# Patient Record
Sex: Male | Born: 1975 | Race: White | Hispanic: No | Marital: Married | Smoking: Current every day smoker
Health system: Southern US, Community
[De-identification: ages and names within clinical notes are randomized; demographics above are authoritative.]

## PROBLEM LIST (undated history)

## (undated) DIAGNOSIS — F1120 Opioid dependence, uncomplicated: Secondary | ICD-10-CM

## (undated) DIAGNOSIS — R945 Abnormal results of liver function studies: Secondary | ICD-10-CM

## (undated) DIAGNOSIS — E7800 Pure hypercholesterolemia, unspecified: Secondary | ICD-10-CM

## (undated) DIAGNOSIS — F419 Anxiety disorder, unspecified: Secondary | ICD-10-CM

## (undated) DIAGNOSIS — G8929 Other chronic pain: Secondary | ICD-10-CM

## (undated) DIAGNOSIS — I10 Essential (primary) hypertension: Secondary | ICD-10-CM

## (undated) DIAGNOSIS — R12 Heartburn: Secondary | ICD-10-CM

## (undated) DIAGNOSIS — K7581 Nonalcoholic steatohepatitis (NASH): Secondary | ICD-10-CM

## (undated) DIAGNOSIS — G4700 Insomnia, unspecified: Secondary | ICD-10-CM

## (undated) DIAGNOSIS — K602 Anal fissure, unspecified: Secondary | ICD-10-CM

## (undated) DIAGNOSIS — R51 Headache: Secondary | ICD-10-CM

## (undated) DIAGNOSIS — G629 Polyneuropathy, unspecified: Secondary | ICD-10-CM

## (undated) DIAGNOSIS — M1990 Unspecified osteoarthritis, unspecified site: Secondary | ICD-10-CM

## (undated) DIAGNOSIS — J449 Chronic obstructive pulmonary disease, unspecified: Secondary | ICD-10-CM

## (undated) DIAGNOSIS — E785 Hyperlipidemia, unspecified: Secondary | ICD-10-CM

## (undated) HISTORY — DX: Unspecified osteoarthritis, unspecified site: M19.90

## (undated) HISTORY — DX: Hyperlipidemia, unspecified: E78.5

## (undated) HISTORY — DX: Headache: R51

## (undated) HISTORY — DX: Polyneuropathy, unspecified: G62.9

## (undated) HISTORY — DX: Heartburn: R12

## (undated) HISTORY — DX: Nonalcoholic steatohepatitis (NASH): K75.81

## (undated) HISTORY — PX: MYRINGOPLASTY: SUR873

## (undated) HISTORY — PX: APPENDECTOMY: SHX54

## (undated) HISTORY — DX: Insomnia, unspecified: G47.00

## (undated) HISTORY — DX: Anxiety disorder, unspecified: F41.9

## (undated) HISTORY — PX: TESTICLE REMOVAL: SHX68

## (undated) HISTORY — PX: CHOLECYSTECTOMY: SHX55

## (undated) HISTORY — DX: Abnormal results of liver function studies: R94.5

## (undated) HISTORY — PX: RECTAL SURGERY: SHX760

## (undated) HISTORY — DX: Opioid dependence, uncomplicated: F11.20

## (undated) HISTORY — PX: INTRAOCULAR PROSTHESES INSERTION: SHX360

## (undated) HISTORY — DX: Chronic obstructive pulmonary disease, unspecified: J44.9

## (undated) HISTORY — DX: Other chronic pain: G89.29

---

## 1998-07-12 ENCOUNTER — Emergency Department (HOSPITAL_COMMUNITY): Admission: EM | Admit: 1998-07-12 | Discharge: 1998-07-12 | Payer: Self-pay | Admitting: Emergency Medicine

## 2009-05-21 ENCOUNTER — Emergency Department (HOSPITAL_COMMUNITY): Admission: EM | Admit: 2009-05-21 | Discharge: 2009-05-21 | Payer: Self-pay | Admitting: Emergency Medicine

## 2013-10-08 DIAGNOSIS — M545 Low back pain, unspecified: Secondary | ICD-10-CM | POA: Insufficient documentation

## 2013-10-08 DIAGNOSIS — F411 Generalized anxiety disorder: Secondary | ICD-10-CM | POA: Insufficient documentation

## 2013-10-08 DIAGNOSIS — E663 Overweight: Secondary | ICD-10-CM | POA: Insufficient documentation

## 2013-11-05 DIAGNOSIS — G44209 Tension-type headache, unspecified, not intractable: Secondary | ICD-10-CM | POA: Insufficient documentation

## 2016-01-26 DIAGNOSIS — G4440 Drug-induced headache, not elsewhere classified, not intractable: Secondary | ICD-10-CM | POA: Insufficient documentation

## 2016-01-26 DIAGNOSIS — G43019 Migraine without aura, intractable, without status migrainosus: Secondary | ICD-10-CM | POA: Insufficient documentation

## 2017-10-28 ENCOUNTER — Emergency Department (HOSPITAL_COMMUNITY): Payer: Medicaid Other

## 2017-10-28 ENCOUNTER — Encounter (HOSPITAL_COMMUNITY): Payer: Self-pay | Admitting: Emergency Medicine

## 2017-10-28 ENCOUNTER — Emergency Department (HOSPITAL_COMMUNITY)
Admission: EM | Admit: 2017-10-28 | Discharge: 2017-10-28 | Disposition: A | Discharge status: Home / Self Care | Payer: Medicaid Other | Attending: Emergency Medicine | Admitting: Emergency Medicine

## 2017-10-28 DIAGNOSIS — N50819 Testicular pain, unspecified: Secondary | ICD-10-CM

## 2017-10-28 DIAGNOSIS — R1030 Lower abdominal pain, unspecified: Secondary | ICD-10-CM | POA: Diagnosis not present

## 2017-10-28 DIAGNOSIS — N50811 Right testicular pain: Secondary | ICD-10-CM | POA: Diagnosis present

## 2017-10-28 DIAGNOSIS — I10 Essential (primary) hypertension: Secondary | ICD-10-CM | POA: Diagnosis not present

## 2017-10-28 DIAGNOSIS — F17200 Nicotine dependence, unspecified, uncomplicated: Secondary | ICD-10-CM | POA: Diagnosis not present

## 2017-10-28 DIAGNOSIS — R000 Tachycardia, unspecified: Secondary | ICD-10-CM | POA: Diagnosis not present

## 2017-10-28 HISTORY — DX: Pure hypercholesterolemia, unspecified: E78.00

## 2017-10-28 HISTORY — DX: Essential (primary) hypertension: I10

## 2017-10-28 HISTORY — DX: Anal fissure, unspecified: K60.2

## 2017-10-28 LAB — URINALYSIS, ROUTINE W REFLEX MICROSCOPIC
BILIRUBIN URINE: NEGATIVE
GLUCOSE, UA: NEGATIVE mg/dL
HGB URINE DIPSTICK: NEGATIVE
Ketones, ur: NEGATIVE mg/dL
Leukocytes, UA: NEGATIVE
Nitrite: NEGATIVE
Protein, ur: NEGATIVE mg/dL
Specific Gravity, Urine: 1.014 (ref 1.005–1.030)
pH: 8 (ref 5.0–8.0)

## 2017-10-28 MED ORDER — LIDOCAINE HCL (PF) 1 % IJ SOLN
INTRAMUSCULAR | Status: AC
  Administered 2017-10-28: 5 mL
  Filled 2017-10-28: qty 5

## 2017-10-28 MED ORDER — METHOCARBAMOL 500 MG PO TABS: 500.0000 mg | ORAL_TABLET | Freq: Two times a day (BID) | ORAL | 0 refills | Status: AC

## 2017-10-28 MED ORDER — CEFTRIAXONE SODIUM 250 MG IJ SOLR
250.0000 mg | Freq: Once | INTRAMUSCULAR | Status: AC
  Administered 2017-10-28: 250 mg via INTRAMUSCULAR
  Filled 2017-10-28: qty 250

## 2017-10-28 MED ORDER — AZITHROMYCIN 250 MG PO TABS
1000.0000 mg | ORAL_TABLET | Freq: Once | ORAL | Status: AC
  Administered 2017-10-28: 1000 mg via ORAL
  Filled 2017-10-28: qty 4

## 2017-10-28 MED ORDER — OXYCODONE-ACETAMINOPHEN 5-325 MG PO TABS
1.0000 | ORAL_TABLET | Freq: Once | ORAL | Status: AC
  Administered 2017-10-28: 1 via ORAL
  Filled 2017-10-28: qty 1

## 2017-10-28 MED ORDER — LEVOFLOXACIN 500 MG PO TABS: 500.0000 mg | ORAL_TABLET | Freq: Every day | ORAL | 0 refills | Status: AC

## 2017-10-28 MED ORDER — MORPHINE SULFATE (PF) 4 MG/ML IV SOLN: 4.0000 mg | Freq: Once | INTRAVENOUS | Status: DC

## 2017-10-28 NOTE — Discharge Instructions (Signed)
Take Levaquin as directed. Follow-up with your urologist for further evaluation. Return to ED for worsening symptoms, fevers, severe abdominal pain, lightheadedness, chest pain.

## 2017-10-28 NOTE — ED Provider Notes (Signed)
[facility] EMERGENCY DEPARTMENT Provider Note   CSN: [id number] Arrival date & time: 10/28/17  1939     History   Chief Complaint Chief Complaint  Patient presents with  . Testicle Pain    HPI Ryan [facility] is a 42 y.o. male with a past medical history of hypertension, status post left orchiectomy done 5 years ago, presents for acute onset of right-sided testicle pain, bladder pain radiating to bilateral flank.  He was receiving oral sexual intercourse from his wife.  States that he sometimes gets pain with ejaculation.  States that the pain today was a lot more severe and he noticed that the ejaculate was blood-tinged.  He denies any history of kidney stones.  Denies any penile discharge or concern for STDs.  He was told that he had a cyst on his right testicle sometime last year.  States that he had a left orchiectomy done because of possible nerve pain/muscle spasm in the area.  Patient denies any vomiting, fever, bowel changes, hematuria.  HPI  Past Medical History:  Diagnosis Date  . High cholesterol   . Hypertension   . Rectal fissure     There are no active problems to display for this patient.   Past Surgical History:  Procedure Laterality Date  . APPENDECTOMY    . CHOLECYSTECTOMY    . INTRAOCULAR PROSTHESES INSERTION    . MYRINGOPLASTY    . TESTICLE REMOVAL Left         Home Medications    Prior to Admission medications   Medication Sig Start Date End Date Taking? Authorizing Provider  levofloxacin (LEVAQUIN) 500 MG tablet Take 1 tablet (500 mg total) by mouth daily for 14 days. 10/28/17 11/11/17  Haitham Dolinsky, PA-C  methocarbamol (ROBAXIN) 500 MG tablet Take 1 tablet (500 mg total) by mouth 2 (two) times daily. 10/28/17   Delia Heady, PA-C    Family History No family history on file.  Social History Social History   Tobacco Use  . Smoking status: Current Every Day Smoker  . Smokeless tobacco: Never Used  Substance Use Topics  .  Alcohol use: Yes    Comment: occ  . Drug use: Never     Allergies   Cymbalta [duloxetine hcl]; Mobic [meloxicam]; Nortriptyline; Toradol [ketorolac tromethamine]; and Tramadol   Review of Systems Review of Systems  Constitutional: Negative for appetite change, chills and fever.  HENT: Negative for ear pain, rhinorrhea, sneezing and sore throat.   Eyes: Negative for photophobia and visual disturbance.  Respiratory: Negative for cough, chest tightness, shortness of breath and wheezing.   Cardiovascular: Negative for chest pain and palpitations.  Gastrointestinal: Positive for abdominal pain. Negative for blood in stool, constipation, diarrhea, nausea and vomiting.  Genitourinary: Positive for flank pain and testicular pain. Negative for dysuria, enuresis, hematuria, penile swelling, scrotal swelling and urgency.       +bloody ejaculate  Musculoskeletal: Negative for myalgias.  Skin: Negative for rash.  Neurological: Negative for dizziness, weakness and light-headedness.     Physical Exam Updated Vital Signs BP (!) 150/98   Pulse (!) 115   Temp 98.4 F (36.9 C) (Oral)   Resp 20   Ht 5' 9"  (1.753 m)   Wt 93.4 kg (206 lb)   SpO2 99%   BMI 30.42 kg/m   Physical Exam  Constitutional: He appears well-developed and well-nourished. No distress.  HENT:  Head: Normocephalic and atraumatic.  Nose: Nose normal.  Eyes: Conjunctivae and EOM are normal. Left eye  exhibits no discharge. No scleral icterus.  Neck: Normal range of motion. Neck supple.  Cardiovascular: Regular rhythm, normal heart sounds and intact distal pulses. Tachycardia present. Exam reveals no gallop and no friction rub.  No murmur heard. Pulmonary/Chest: Effort normal and breath sounds normal. No respiratory distress.  Abdominal: Soft. Bowel sounds are normal. He exhibits no distension. There is tenderness in the suprapubic area. There is CVA tenderness. There is no guarding.    Genitourinary: Right testis shows  tenderness. Right testis shows no swelling. Circumcised.  Genitourinary Comments: No L testicle noted. Normal male genitalia noted. Penis, scrotum, and testicle without swelling, lesions, or rashes noted.  There is point tenderness of the right testicle.  No penile discharge noted. Cremasteric reflex intact. Tech served as Producer, television/film/video during the exam.   Musculoskeletal: Normal range of motion. He exhibits no edema.  Neurological: He is alert. He exhibits normal muscle tone. Coordination normal.  Skin: Skin is warm and dry. No rash noted.  Psychiatric: He has a normal mood and affect.  Nursing note and vitals reviewed.    ED Treatments / Results  Labs (all labs ordered are listed, but only abnormal results are displayed) Labs Reviewed  URINALYSIS, ROUTINE W REFLEX MICROSCOPIC - Abnormal; Notable for the following components:      Result Value   APPearance CLOUDY (*)    All other components within normal limits  GC/CHLAMYDIA PROBE AMP ([place]) NOT AT [facility]    EKG None  Radiology Ct Renal Stone Study  Result Date: 10/28/2017 CLINICAL DATA:  Left groin pain EXAM: CT ABDOMEN AND PELVIS WITHOUT CONTRAST TECHNIQUE: Multidetector CT imaging of the abdomen and pelvis was performed following the standard protocol without IV contrast. COMPARISON:  CT 07/06/2017 FINDINGS: Lower chest: Lung bases demonstrate no acute consolidation or effusion. Normal heart size. Hepatobiliary: No focal liver abnormality is seen. Status post cholecystectomy. No biliary dilatation. Pancreas: Unremarkable. No pancreatic ductal dilatation or surrounding inflammatory changes. Spleen: Normal in size without focal abnormality. Adrenals/Urinary Tract: Adrenal glands are unremarkable. Kidneys are normal, without renal calculi, focal lesion, or hydronephrosis. Bladder is unremarkable. Stomach/Bowel: Stomach is within normal limits. Appendix appears surgically absent. No evidence of bowel wall thickening, distention, or  inflammatory changes. Vascular/Lymphatic: Mild aortic atherosclerosis. No aneurysmal dilatation. No significantly enlarged lymph nodes. Reproductive: Prostate is unremarkable. Other: Small fat in the right inguinal canal. No free air or free fluid. Musculoskeletal: No acute or significant osseous findings. IMPRESSION: 1. Negative for nephrolithiasis, hydronephrosis or ureteral stone 2. No CT evidence for acute intra-abdominal or pelvic abnormality Electronically Signed   By: Donavan Foil M.D.   On: 10/28/2017 21:56   US Scrotum W/doppler  Result Date: 10/28/2017 CLINICAL DATA:  RIGHT testicular pain intermittently for years, post LEFT orchiectomy 5 years ago EXAM: SCROTAL ULTRASOUND DOPPLER ULTRASOUND OF THE TESTICLES TECHNIQUE: Complete ultrasound examination of the testicles, epididymis, and other scrotal structures was performed. Color and spectral Doppler ultrasound were also utilized to evaluate blood flow to the testicles. COMPARISON:  04/19/2017 FINDINGS: Right testicle Measurements: 4.8 x 3.4 x 2.9 cm. Normal morphology without mass or calcification. Internal blood flow present on color Doppler imaging. Left testicle Surgically absent Right epididymis:  Normal in size and appearance. Left epididymis:  Surgically absent Hydrocele:  Small RIGHT hydrocele. Varicocele:  None visualized. Pulsed Doppler interrogation of both testes demonstrates normal low resistance arterial and venous waveforms bilaterally. IMPRESSION: Normal appearing RIGHT testis and RIGHT epididymis. Small nonspecific RIGHT hydrocele. Post LEFT orchiectomy. Electronically Signed  By: Lavonia Dana M.D.   On: 10/28/2017 21:12    Procedures Procedures (including critical care time)  Medications Ordered in ED Medications  cefTRIAXone (ROCEPHIN) injection 250 mg (has no administration in time range)  azithromycin (ZITHROMAX) tablet 1,000 mg (has no administration in time range)  lidocaine (PF) (XYLOCAINE) 1 % injection (has no  administration in time range)  oxyCODONE-acetaminophen (PERCOCET/ROXICET) 5-325 MG per tablet 1 tablet (1 tablet Oral Given 10/28/17 2124)     Initial Impression / Assessment and Plan / ED Course  I have reviewed the triage vital signs and the nursing notes.  Pertinent labs & imaging results that were available during my care of the patient were reviewed by me and considered in my medical decision making (see chart for details).     42 year old male with a past medical history of hypertension, status post left orchiectomy done 5 years ago presents for acute onset of right-sided testicle pain, bladder pain radiating to bilateral flank.  He was receiving oral sexual intercourse by his wife when he began having severe pain with ejaculation.  He noticed his ejaculate was blood-tinged.  Denies any history of kidney stones.  Denies any penile discharge or concern for STDs.  He was told sometime last year that he had a cyst on his right testicle.  Denies any vomiting, bowel changes, fever, hematuria.  On physical exam patient has tenderness to palpation of the lower abdomen and bilateral CVA tenderness.  No abnormalities noted on GU exam.  Scrotal ultrasound negative for acute abnormality of the right testicle and epididymis.  Urinalysis with no evidence of infection.  Gonorrhea and chlamydia probe sent.  CT renal stone study with no acute findings.  Will treat patient empirically for gonorrhea and chlamydia, cover with Levaquin for possible prostatitis.  Advised him to follow-up with urology and to return to ED for any severe worsening symptoms.  Portions of this note were generated with Lobby[employer]. Dictation errors may occur despite best attempts at proofreading.   Final Clinical Impressions(s) / ED Diagnoses   Final diagnoses:  Testicular pain    ED Discharge Orders        Ordered    levofloxacin (LEVAQUIN) 500 MG tablet  Daily     10/28/17 2203    methocarbamol (ROBAXIN) 500 MG  tablet  2 times daily     10/28/17 2219       Delia Heady, PA-C 10/28/17 2219    Pattricia Boss, MD 10/29/17 [phone]

## 2017-10-28 NOTE — ED Triage Notes (Addendum)
Pt c/o pain R sided testicle pain since climaxing today during oral sex with wife. Pt states he could see bright red blood in ejaculate today.  Pt reports reoccurring pain in testicles since having L testicle removed 5 years ago. Last year pt had u/s of R testicle showing ?cyst, no surgical intervention occurred with latest finding.  Pt states pain now radiating into bladder groin  And around to flank

## 2018-03-17 ENCOUNTER — Encounter: Payer: Self-pay | Admitting: Cardiology

## 2018-03-18 ENCOUNTER — Encounter (HOSPITAL_COMMUNITY): Payer: Self-pay

## 2018-03-18 ENCOUNTER — Other Ambulatory Visit: Payer: Self-pay

## 2018-03-18 ENCOUNTER — Emergency Department (HOSPITAL_COMMUNITY)
Admission: EM | Admit: 2018-03-18 | Discharge: 2018-03-18 | Disposition: A | Discharge status: Home / Self Care | Payer: Medicaid Other | Attending: Emergency Medicine | Admitting: Emergency Medicine

## 2018-03-18 DIAGNOSIS — R2243 Localized swelling, mass and lump, lower limb, bilateral: Secondary | ICD-10-CM | POA: Insufficient documentation

## 2018-03-18 DIAGNOSIS — M79605 Pain in left leg: Secondary | ICD-10-CM | POA: Diagnosis not present

## 2018-03-18 DIAGNOSIS — R1907 Generalized intra-abdominal and pelvic swelling, mass and lump: Secondary | ICD-10-CM | POA: Diagnosis not present

## 2018-03-18 DIAGNOSIS — R609 Edema, unspecified: Secondary | ICD-10-CM | POA: Diagnosis present

## 2018-03-18 DIAGNOSIS — F17200 Nicotine dependence, unspecified, uncomplicated: Secondary | ICD-10-CM | POA: Insufficient documentation

## 2018-03-18 DIAGNOSIS — R0789 Other chest pain: Secondary | ICD-10-CM | POA: Insufficient documentation

## 2018-03-18 DIAGNOSIS — M79604 Pain in right leg: Secondary | ICD-10-CM | POA: Diagnosis not present

## 2018-03-18 DIAGNOSIS — I10 Essential (primary) hypertension: Secondary | ICD-10-CM | POA: Insufficient documentation

## 2018-03-18 NOTE — Discharge Instructions (Addendum)
The swelling your lower legs should improve with the use the diuretic medicine that you are prescribed recently.  Additionally make sure that you are sleeping as flat as possible, to improve blood flow to your heart.  Once in your heart the blood can carry fluids to your kidneys and then you can urinate.  It will help to use compression stockings on your lower legs, to help push the fluid back up.  Also staying in a low-sodium diet is very important for people that have edema.  Return here if needed, for worsening symptoms or other concerns.

## 2018-03-18 NOTE — ED Provider Notes (Signed)
[facility] Provider Note   CSN: [id number] Arrival date & time: 03/18/18  1940     History   Chief Complaint Chief Complaint  Patient presents with  . Leg Swelling  . Weight Gain  . Shortness of Breath    HPI Ryan [facility] is a 42 y.o. male.  HPI   He is here for evaluation of a multitude of symptoms.  Primarily today he is concerned about pain and swelling in the legs, bilaterally which has worsened over the last week.  The pain was so bad that he decided to see a doctor at the [facility] in [place], [place], 3 days ago.  At that time he had comprehensive evaluation, apparently with CT imaging cardiac evaluation and blood work.  After that he was prescribed a diuretic pill, but he did not start taking it, yet.  He is somewhat mistrustful of the diagnosis, therefore came here.  He also states that he has chronic difficulty lying flat because he gets short of breath.  Because of that he tends to sleep in a recliner.  He relates having vague chest discomfort for 10 months.  He reports having a 16 pound weight gain in the last week.  He complains of anxiety.  He is not currently working.  He is here with his wife who is concerned about some swelling in his abdomen.  There are no other known modifying factors.  Past Medical History:  Diagnosis Date  . High cholesterol   . Hypertension   . Rectal fissure     There are no active problems to display for this patient.   Past Surgical History:  Procedure Laterality Date  . APPENDECTOMY    . CHOLECYSTECTOMY    . INTRAOCULAR PROSTHESES INSERTION    . MYRINGOPLASTY    . TESTICLE REMOVAL Left         Home Medications    Prior to Admission medications   Medication Sig Start Date End Date Taking? Authorizing Provider  methocarbamol (ROBAXIN) 500 MG tablet Take 1 tablet (500 mg total) by mouth 2 (two) times daily. 10/28/17   Delia Heady, PA-C    Family History History  reviewed. No pertinent family history.  Social History Social History   Tobacco Use  . Smoking status: Current Every Day Smoker  . Smokeless tobacco: Never Used  Substance Use Topics  . Alcohol use: Yes    Comment: occ  . Drug use: Never     Allergies   Cymbalta [duloxetine hcl]; Mobic [meloxicam]; Nortriptyline; Toradol [ketorolac tromethamine]; and Tramadol   Review of Systems Review of Systems  All other systems reviewed and are negative.    Physical Exam Updated Vital Signs BP (!) 146/92 (BP Location: Right Arm)   Pulse (!) 112   Temp 98.8 F (37.1 C) (Oral)   Ht 5' 8"  (1.727 m)   Wt 105.2 kg   SpO2 100%   BMI 35.28 kg/m   Physical Exam  Constitutional: He is oriented to person, place, and time. He appears well-developed and well-nourished. He appears distressed (Uncomfortable).  HENT:  Head: Normocephalic and atraumatic.  Right Ear: External ear normal.  Left Ear: External ear normal.  Eyes: Pupils are equal, round, and reactive to light. Conjunctivae and EOM are normal.  Neck: Normal range of motion and phonation normal. Neck supple.  Cardiovascular: Normal rate, regular rhythm and normal heart sounds.  Pulmonary/Chest: Effort normal and breath sounds normal. He exhibits no bony tenderness.  Abdominal:  Soft. He exhibits no distension and no mass. There is no tenderness. There is no guarding.  Musculoskeletal: Normal range of motion. He exhibits edema (Mild bilateral lower extremity edema 1-2+.).  Nonspecific diffuse tenderness of the lower legs and feet bilaterally.  No deformities of the legs appreciated.  No overlying skin changes.  Extremities are warm and dry.  Neurological: He is alert and oriented to person, place, and time. No cranial nerve deficit or sensory deficit. He exhibits normal muscle tone. Coordination normal.  Skin: Skin is warm, dry and intact.  Nonspecific irregular small patch of redness, right anterior shin.  No areas of drainage,  bleeding or petechiae.  Psychiatric: He has a normal mood and affect. His behavior is normal. Judgment and thought content normal.  Nursing note and vitals reviewed.    ED Treatments / Results  Labs (all labs ordered are listed, but only abnormal results are displayed) Labs Reviewed - No data to display  EKG None  Radiology No results found.  Procedures Procedures (including critical care time)  Medications Ordered in ED Medications - No data to display   Initial Impression / Assessment and Plan / ED Course  I have reviewed the triage vital signs and the nursing notes.  Pertinent labs & imaging results that were available during my care of the patient were reviewed by me and considered in my medical decision making (see chart for details).      Patient Vitals for the past 24 hrs:  BP Temp Temp src Pulse SpO2 Height Weight  03/18/18 2024 - - - - - 5' 8"  (1.727 m) 105.2 kg  03/18/18 2022 (!) 146/92 98.8 F (37.1 C) Oral (!) 112 100 % - -  03/18/18 2020 - - - - 100 % - -    9:42 PM Reevaluation with update and discussion. After initial assessment and treatment, an updated evaluation reveals no change in clinical status.  We discussed his recent imaging which I am able to see through the PACS system.  He had negative chest x-ray and CT angiogram, several days ago at [facility].  He was prescribed a diuretic which she has not started taking yet.  Findings discussed with patient and wife, questions answered.Daleen Bo   Medical Decision Making: Nonspecific lower extremity swelling without evidence for acute congestive heart failure, metabolic instability or impending vascular collapse.  Patient had recent comprehensive imaging, and an outlying hospital.  Patient essentially came here for second opinion, and I reassured him that he is on the proper treatment course with a diuretic, and then close follow-up with his PCP.  He is wife are in agreement with that plan.  We  also discussed use of compression stockings and low-sodium diet.  CRITICAL CARE-no Performed by: Daleen Bo  Nursing Notes Reviewed/ Care Coordinated Applicable Imaging Reviewed Interpretation of Laboratory Data incorporated into ED treatment  The patient appears reasonably screened and/or stabilized for discharge and I doubt any other medical condition or other [facility] requiring further screening, evaluation, or treatment in the ED at this time prior to discharge.  Plan: Home Medications-continue current prescribed medications; Home Treatments-rest, elevation, low-salt diet.; return here if the recommended treatment, does not improve the symptoms; Recommended follow up-recheck in 1 week and as needed.    Final Clinical Impressions(s) / ED Diagnoses   Final diagnoses:  Peripheral edema    ED Discharge Orders    None       Daleen Bo, MD 03/19/18 1154

## 2018-03-18 NOTE — ED Triage Notes (Signed)
Pt states he has been gaining weight over past 2 months (30 lbs). Here today for bilat lower extremity edema, abdominal distention, SOB, and fatigue. States dr gave him fluid pills one time but never tested him for anything. Wife and son at bedside.

## 2018-04-19 ENCOUNTER — Ambulatory Visit: Payer: Medicaid Other | Admitting: Neurology

## 2018-04-26 ENCOUNTER — Encounter: Payer: Self-pay | Admitting: Cardiology

## 2018-04-26 DIAGNOSIS — R55 Syncope and collapse: Secondary | ICD-10-CM | POA: Insufficient documentation

## 2018-04-26 DIAGNOSIS — E785 Hyperlipidemia, unspecified: Secondary | ICD-10-CM | POA: Insufficient documentation

## 2018-05-03 ENCOUNTER — Encounter: Payer: Self-pay | Admitting: Cardiology

## 2018-05-03 ENCOUNTER — Ambulatory Visit (INDEPENDENT_AMBULATORY_CARE_PROVIDER_SITE_OTHER): Payer: Medicaid Other | Admitting: Cardiology

## 2018-05-03 VITALS — BP 140/82 | HR 91 | Ht 68.0 in | Wt 220.0 lb

## 2018-05-03 DIAGNOSIS — R0789 Other chest pain: Secondary | ICD-10-CM | POA: Insufficient documentation

## 2018-05-03 DIAGNOSIS — R55 Syncope and collapse: Secondary | ICD-10-CM

## 2018-05-03 DIAGNOSIS — E785 Hyperlipidemia, unspecified: Secondary | ICD-10-CM

## 2018-05-03 DIAGNOSIS — R002 Palpitations: Secondary | ICD-10-CM | POA: Insufficient documentation

## 2018-05-03 DIAGNOSIS — F17200 Nicotine dependence, unspecified, uncomplicated: Secondary | ICD-10-CM | POA: Insufficient documentation

## 2018-05-03 NOTE — Patient Instructions (Signed)
Medication Instructions:   Your physician recommends that you continue on your current medications as directed. Please refer to the Current Medication list given to you today.  If you need a refill on your cardiac medications before your next appointment, please call your pharmacy.   Lab work:  NONE  If you have labs (blood work) drawn today and your tests are completely normal, you will receive your results only by: Marland Kitchen MyChart Message (if you have MyChart) OR . A paper copy in the mail If you have any lab test that is abnormal or we need to change your treatment, we will call you to review the results.  Testing/Procedures:  Your physician has recommended that you wear an event monitor. Event monitors are medical devices that record the heart's electrical activity. Doctors most often [place] these monitors to diagnose arrhythmias. Arrhythmias are problems with the speed or rhythm of the heartbeat. The monitor is a small, portable device. You can wear one while you do your normal daily activities. This is usually used to diagnose what is causing palpitations/syncope (passing out).  Your physician has requested that you have an echocardiogram. Echocardiography is a painless test that uses sound waves to create images of your heart. It provides your doctor with information about the size and shape of your heart and how well your heart's chambers and valves are working. This procedure takes approximately one hour. There are no restrictions for this procedure.  Your physician has requested that you have a lexiscan myoview. For further information please visit [email or web address]. Please follow instruction sheet, as given.     Follow-Up: At [facility], you and your health needs are our priority.  As part of our continuing mission to provide you with exceptional heart care, we have created designated Provider Care Teams.  These Care Teams include your primary Cardiologist (physician) and Advanced  Practice Providers (APPs -  Physician Assistants and Nurse Practitioners) who all work together to provide you with the care you need, when you need it.  You will need a follow up appointment in 6 months.

## 2018-05-03 NOTE — Progress Notes (Signed)
Cardiology Office Note:    Date:  05/03/2018   ID:  Ryan Barr, DOB 1975/10/22, MRN [id number]  PCP:  Janine Limbo, PA-C  Cardiologist:  Jenean Lindau, MD   Referring MD: Janine Limbo, PA-C    ASSESSMENT:    1. Chest discomfort   2. Syncope, unspecified syncope type   3. Hyperlipidemia, unspecified hyperlipidemia type   4. Current every day smoker   5. Palpitations    PLAN:    In order of problems listed above:  1. The patient's history is difficult to interpret.  He is a poor historian. 2. He has multiple risk factors for coronary artery disease and in view of his chest discomfort we will do a Lexiscan sestamibi.  Echocardiogram will be done to assess murmur heard on auscultation. 3. He gives history of some palpitations and a possible syncope in the past.  In view of this I will do a one-month Holter monitoring on this gentleman.  In view of his syncopal history also I have advised him not to drive to the [facility] regulations until our evaluation is complete till his primary care provider also evaluates him for the symptoms. 4. Patient will be seen in follow-up appointment in 6 months or earlier if the patient has any concerns    Medication Adjustments/Labs and Tests Ordered: Current medicines are reviewed at length with the patient today.  Concerns regarding medicines are outlined above.  No orders of the defined types were placed in this encounter.  No orders of the defined types were placed in this encounter.    History of Present Illness:    Ryan Barr is a 43 y.o. male who is being seen today for the evaluation of episode of syncope at the request of Poston, PA-C.  Patient is a poor historian.  Patient mentions to me that he is on pain medications for significant pain all the time.  Occasionally he has chest discomfort he is concerned about it.  No radiation of the symptoms to the neck or to the arms.  Unfortunately he smokes significantly from a young age.  At  the time of my evaluation, the patient is alert awake oriented and in no distress.  Patient mentions to me that he had an episode of syncope though he does not provide much details.  Past Medical History:  Diagnosis Date  . Abnormal liver function   . Anxiety   . Arthritis   . Chronic headaches   . COPD (chronic obstructive pulmonary disease) ([place])   . Dyslipidemia   . Heartburn   . High cholesterol   . Hypertension   . Insomnia   . NASH (nonalcoholic steatohepatitis)   . Neuropathy   . Opiate addiction ([place])   . Rectal fissure     Past Surgical History:  Procedure Laterality Date  . APPENDECTOMY    . CHOLECYSTECTOMY    . INTRAOCULAR PROSTHESES INSERTION    . MYRINGOPLASTY    . RECTAL SURGERY    . TESTICLE REMOVAL Left     Current Medications: Current Meds  Medication Sig  . ADVAIR DISKUS 250-50 MCG/DOSE AEPB INL 1 PUFF PO BID  . ALPRAZolam (XANAX) 1 MG tablet Take 1 tablet by mouth 3 (three) times daily.  . carbamazepine (CARBATROL) 200 MG 12 hr capsule Take 1 capsule by mouth 2 (two) times daily.  . methocarbamol (ROBAXIN) 500 MG tablet Take 1 tablet (500 mg total) by mouth 2 (two) times daily.  Marland Kitchen omeprazole (PRILOSEC) 40 MG capsule  Take 1 capsule by mouth daily.  Marland Kitchen PARoxetine (PAXIL) 20 MG tablet Take 1 tablet by mouth daily.  . pregabalin (LYRICA) 50 MG capsule Take 1 capsule by mouth 3 (three) times daily.  . QUEtiapine (SEROQUEL) 100 MG tablet Take 1 tablet by mouth at bedtime.  . rosuvastatin (CRESTOR) 10 MG tablet Take 1 tablet by mouth daily.  . SUBOXONE 8-2 MG FILM DISSOLVE 2 FLIM UNDER TONGUE DAILY  . sucralfate (CARAFATE) 1 g tablet Take 1 tablet by mouth as needed.  . SUMAtriptan (IMITREX) 100 MG tablet Take 1 tablet by mouth as needed.     Allergies:   Varenicline; Cymbalta [duloxetine hcl]; Mobic [meloxicam]; Nortriptyline; Toradol [ketorolac tromethamine]; and Tramadol   Social History   Socioeconomic History  . Marital status: Married    Spouse  name: Not on file  . Number of children: Not on file  . Years of education: Not on file  . Highest education level: Not on file  Occupational History  . Not on file  Social Needs  . Financial resource strain: Not on file  . Food insecurity:    Worry: Not on file    Inability: Not on file  . Transportation needs:    Medical: Not on file    Non-medical: Not on file  Tobacco Use  . Smoking status: Current Every Day Smoker  . Smokeless tobacco: Never Used  Substance and Sexual Activity  . Alcohol use: Yes    Comment: occ  . Drug use: Never  . Sexual activity: Yes  Lifestyle  . Physical activity:    Days per week: Not on file    Minutes per session: Not on file  . Stress: Not on file  Relationships  . Social connections:    Talks on phone: Not on file    Gets together: Not on file    Attends religious service: Not on file    Active member of club or organization: Not on file    Attends meetings of clubs or organizations: Not on file    Relationship status: Not on file  Other Topics Concern  . Not on file  Social History Narrative  . Not on file     Family History: The patient's family history includes Arthritis in his brother, father, maternal grandfather, maternal grandmother, mother, paternal grandfather, and paternal grandmother; Cancer in his father, maternal grandfather, maternal grandmother, paternal grandfather, and paternal grandmother; Diabetes in his maternal grandfather, maternal grandmother, and sister; Heart attack in his maternal grandfather and maternal grandmother; Hyperlipidemia in his maternal grandfather, maternal grandmother, paternal grandfather, and paternal grandmother; Hypertension in his maternal grandfather, maternal grandmother, mother, paternal grandfather, and paternal grandmother; Hypothyroidism in his sister; Seizures in his maternal grandfather and maternal grandmother; Sickle cell anemia in his father.  ROS:   Please see the history of present  illness.    All other systems reviewed and are negative.  EKGs/Labs/Other Studies Reviewed:    The following studies were reviewed today: KG reveals sinus rhythm and nonspecific ST-T changes   Recent Labs: No results found for requested labs within last 8760 hours.  Recent Lipid Panel No results found for: CHOL, TRIG, HDL, CHOLHDL, VLDL, LDLCALC, LDLDIRECT  Physical Exam:    VS:  BP 140/82 (BP Location: Right Arm, Patient Position: Sitting, Cuff Size: Normal)   Pulse 91   Ht 5' 8"  (1.727 m)   Wt 220 lb (99.8 kg)   SpO2 98%   BMI 33.45 kg/m     Wt  Readings from Last 3 Encounters:  05/03/18 220 lb (99.8 kg)  03/18/18 232 lb (105.2 kg)  10/28/17 206 lb (93.4 kg)     GEN: Patient is in no acute distress HEENT: Normal NECK: No JVD; No carotid bruits LYMPHATICS: No lymphadenopathy CARDIAC: S1 S2 regular, 2/6 systolic murmur at the apex. RESPIRATORY:  Clear to auscultation without rales, wheezing or rhonchi  ABDOMEN: Soft, non-tender, non-distended MUSCULOSKELETAL:  No edema; No deformity  SKIN: Warm and dry NEUROLOGIC:  Alert and oriented x 3 PSYCHIATRIC:  Normal affect    Signed, Jenean Lindau, MD  05/03/2018 4:05 PM     [facility]

## 2018-05-21 ENCOUNTER — Telehealth (HOSPITAL_COMMUNITY): Payer: Self-pay | Admitting: *Deleted

## 2018-05-21 NOTE — Telephone Encounter (Signed)
Patient given detailed instructions per Myocardial Perfusion Study Information Sheet for the test on  05/23/18. Patient notified to arrive 15 minutes early and that it is imperative to arrive on time for appointment to keep from having the test rescheduled.  If you need to cancel or reschedule your appointment, please call the office within 24 hours of your appointment. . Patient verbalized understanding. Ryan Barr

## 2018-05-23 ENCOUNTER — Encounter: Payer: Self-pay | Admitting: *Deleted

## 2018-05-23 ENCOUNTER — Ambulatory Visit: Payer: Medicaid Other

## 2018-05-25 ENCOUNTER — Ambulatory Visit (INDEPENDENT_AMBULATORY_CARE_PROVIDER_SITE_OTHER): Payer: Medicaid Other

## 2018-05-25 DIAGNOSIS — R002 Palpitations: Secondary | ICD-10-CM | POA: Diagnosis not present

## 2018-05-30 ENCOUNTER — Telehealth: Payer: Self-pay | Admitting: Cardiology

## 2018-05-30 NOTE — Telephone Encounter (Signed)
Patient has been battling with bilateral lower extremity, ankle, and foot swelling for the past 11 months. He has noticed that the swelling has worsened over the past 2 weeks. Patient reports shortness of breath on exertion and denies checking daily weights or any vital signs regularly. Patient states that [facility] prescribed him a diuretic at some point last year but cannot recall the name or dosage of this medication. He took the 30 day supply that was prescribed and it did improve the swelling.   Patient is currently wearing a 30 day event monitor that was placed on 05/25/2018. He is scheduled for an echocardiogram on 06/14/2018 and a lexiscan on 06/20/2018 in the [place] office.   The most recent [facility] records from ED visit on 05/21/2018 have been pulled and reviewed by Dr. Geraldo Pitter who recommends patient continue current medications as prescribed. Will advise patient to monitor his daily weights, BP, and HR daily and keep a log of these readings. Patient will be advised to go to the nearest emergency room to be evaluated if symptoms worsen until testing has been completed per Dr. Geraldo Pitter.

## 2018-05-30 NOTE — Telephone Encounter (Signed)
Spoke with patient and advised him of Dr. Julien Nordmann recommendations and plan. Patient verbalized understanding. Patient asked if there was any availability for a sooner appointment for his echocardiogram or lexiscan. Informed patient I would check with our scheduler and call him back with an update.   Left message on patient's cell phone per DPR informing him that no sooner appointments for testing are available at this time and advised patient to keep scheduled appointments and follow pre-testing instructions. Informed him to contact our office with any further questions or concerns.

## 2018-05-30 NOTE — Telephone Encounter (Signed)
Patient called because he is having severe swelling in his feet and ankles and they are sometimes 3 x bigger than normal, Dr Renaye Rakers stopped his fluid pill and he is in pain in his feet and severepressure and unable to walk on them some days, Please call patient

## 2018-06-14 ENCOUNTER — Ambulatory Visit (INDEPENDENT_AMBULATORY_CARE_PROVIDER_SITE_OTHER): Payer: Medicaid Other

## 2018-06-14 DIAGNOSIS — R55 Syncope and collapse: Secondary | ICD-10-CM

## 2018-06-14 DIAGNOSIS — R002 Palpitations: Secondary | ICD-10-CM | POA: Diagnosis not present

## 2018-06-14 DIAGNOSIS — R0789 Other chest pain: Secondary | ICD-10-CM

## 2018-06-14 NOTE — Progress Notes (Signed)
Complete echocardiogram has been performed.  Jorey Ivery Nanney RDCS, RVT 

## 2018-06-18 ENCOUNTER — Telehealth (HOSPITAL_COMMUNITY): Payer: Self-pay | Admitting: *Deleted

## 2018-06-18 NOTE — Telephone Encounter (Signed)
Attempted to call patient regarding upcoming appointment- no answer.  Ryan Barr

## 2018-06-20 ENCOUNTER — Ambulatory Visit (INDEPENDENT_AMBULATORY_CARE_PROVIDER_SITE_OTHER): Payer: Medicaid Other

## 2018-06-20 VITALS — Ht 68.0 in | Wt 220.0 lb

## 2018-06-20 DIAGNOSIS — R0789 Other chest pain: Secondary | ICD-10-CM

## 2018-06-20 LAB — MYOCARDIAL PERFUSION IMAGING
CHL CUP NUCLEAR SDS: 1
CHL CUP NUCLEAR SSS: 2
LV dias vol: 103 mL (ref 62–150)
LV sys vol: 33 mL
NUC STRESS TID: 0.97
Peak HR: 76 {beats}/min
Rest HR: 73 {beats}/min
SRS: 1

## 2018-06-20 MED ORDER — TECHNETIUM TC 99M TETROFOSMIN IV KIT
31.8000 | PACK | Freq: Once | INTRAVENOUS | Status: AC | PRN
  Administered 2018-06-20: 31.8 via INTRAVENOUS

## 2018-06-20 MED ORDER — REGADENOSON 0.4 MG/5ML IV SOLN
0.4000 mg | Freq: Once | INTRAVENOUS | Status: AC
  Administered 2018-06-20: 0.4 mg via INTRAVENOUS

## 2018-06-20 MED ORDER — TECHNETIUM TC 99M TETROFOSMIN IV KIT
9.6000 | PACK | Freq: Once | INTRAVENOUS | Status: AC | PRN
  Administered 2018-06-20: 9.6 via INTRAVENOUS

## 2018-06-21 ENCOUNTER — Telehealth: Payer: Self-pay

## 2018-06-21 NOTE — Telephone Encounter (Signed)
When I called patient to discuss results patient states he is having chest pain and shortness of breath and legs and feet are swollen and very concern about his symptoms please advise.

## 2018-06-21 NOTE — Telephone Encounter (Signed)
-----   Message from Jenean Lindau, MD sent at 06/21/2018  8:28 AM EST ----- The results of the study is unremarkable. Please inform patient. I will discuss in detail at next appointment. Cc  primary care/referring physician Jenean Lindau, MD 06/21/2018 8:28 AM

## 2018-06-22 NOTE — Telephone Encounter (Signed)
Called patient and informed him of Dr Julien Nordmann instructions and patients understands to to go to the ER if he keeps having those symptoms

## 2018-06-22 NOTE — Telephone Encounter (Signed)
Needs to go to ER

## 2018-06-28 ENCOUNTER — Telehealth: Payer: Self-pay

## 2018-06-28 NOTE — Telephone Encounter (Signed)
Patient called and notified of test results.

## 2018-06-28 NOTE — Telephone Encounter (Signed)
-----   Message from Jenean Lindau, MD sent at 06/28/2018  1:03 PM EST ----- The results of the study is unremarkable. Please inform patient. I will discuss in detail at next appointment. Cc  primary care/referring physician Jenean Lindau, MD 06/28/2018 1:03 PM

## 2018-09-30 ENCOUNTER — Emergency Department (HOSPITAL_COMMUNITY): Payer: Medicaid Other

## 2018-09-30 ENCOUNTER — Other Ambulatory Visit: Payer: Self-pay

## 2018-09-30 ENCOUNTER — Encounter (HOSPITAL_COMMUNITY): Payer: Self-pay

## 2018-09-30 ENCOUNTER — Emergency Department (HOSPITAL_COMMUNITY)
Admission: EM | Admit: 2018-09-30 | Discharge: 2018-10-01 | Disposition: A | Discharge status: Home / Self Care | Payer: Medicaid Other | Attending: Emergency Medicine | Admitting: Emergency Medicine

## 2018-09-30 DIAGNOSIS — I10 Essential (primary) hypertension: Secondary | ICD-10-CM | POA: Insufficient documentation

## 2018-09-30 DIAGNOSIS — R0602 Shortness of breath: Secondary | ICD-10-CM | POA: Insufficient documentation

## 2018-09-30 DIAGNOSIS — R0609 Other forms of dyspnea: Secondary | ICD-10-CM

## 2018-09-30 DIAGNOSIS — F17200 Nicotine dependence, unspecified, uncomplicated: Secondary | ICD-10-CM | POA: Insufficient documentation

## 2018-09-30 DIAGNOSIS — R109 Unspecified abdominal pain: Secondary | ICD-10-CM | POA: Insufficient documentation

## 2018-09-30 DIAGNOSIS — R600 Localized edema: Secondary | ICD-10-CM | POA: Insufficient documentation

## 2018-09-30 DIAGNOSIS — J449 Chronic obstructive pulmonary disease, unspecified: Secondary | ICD-10-CM | POA: Diagnosis not present

## 2018-09-30 DIAGNOSIS — Z79899 Other long term (current) drug therapy: Secondary | ICD-10-CM | POA: Insufficient documentation

## 2018-09-30 DIAGNOSIS — Z20828 Contact with and (suspected) exposure to other viral communicable diseases: Secondary | ICD-10-CM | POA: Insufficient documentation

## 2018-09-30 DIAGNOSIS — R140 Abdominal distension (gaseous): Secondary | ICD-10-CM | POA: Insufficient documentation

## 2018-09-30 DIAGNOSIS — R2243 Localized swelling, mass and lump, lower limb, bilateral: Secondary | ICD-10-CM | POA: Diagnosis present

## 2018-09-30 LAB — COMPREHENSIVE METABOLIC PANEL
ALT: 25 U/L (ref 0–44)
AST: 37 U/L (ref 15–41)
Albumin: 4.1 g/dL (ref 3.5–5.0)
Alkaline Phosphatase: 151 U/L — ABNORMAL HIGH (ref 38–126)
Anion gap: 10 (ref 5–15)
BUN: 10 mg/dL (ref 6–20)
CO2: 25 mmol/L (ref 22–32)
Calcium: 9.2 mg/dL (ref 8.9–10.3)
Chloride: 101 mmol/L (ref 98–111)
Creatinine, Ser: 0.75 mg/dL (ref 0.61–1.24)
GFR calc Af Amer: >60 mL/min (ref >60)
GFR calc non Af Amer: >60 mL/min (ref >60)
Glucose, Bld: 99 mg/dL (ref 70–99)
Potassium: 3.7 mmol/L (ref 3.5–5.1)
Sodium: 136 mmol/L (ref 135–145)
Total Bilirubin: 0.4 mg/dL (ref 0.3–1.2)
Total Protein: 7 g/dL (ref 6.5–8.1)

## 2018-09-30 LAB — URINALYSIS, ROUTINE W REFLEX MICROSCOPIC
Bacteria, UA: NONE SEEN
Bilirubin Urine: NEGATIVE
Glucose, UA: NEGATIVE mg/dL
Hgb urine dipstick: NEGATIVE
Ketones, ur: NEGATIVE mg/dL
Nitrite: NEGATIVE
Protein, ur: NEGATIVE mg/dL
Specific Gravity, Urine: 1.015 (ref 1.005–1.030)
pH: 6 (ref 5.0–8.0)

## 2018-09-30 LAB — SARS CORONAVIRUS 2 BY RT PCR (HOSPITAL ORDER, PERFORMED IN ~~LOC~~ HOSPITAL LAB): SARS Coronavirus 2: NEGATIVE

## 2018-09-30 LAB — BRAIN NATRIURETIC PEPTIDE: B Natriuretic Peptide: 53.3 pg/mL (ref 0.0–100.0)

## 2018-09-30 LAB — LIPASE, BLOOD: Lipase: 21 U/L (ref 11–51)

## 2018-09-30 LAB — CBC
HCT: 36.8 % — ABNORMAL LOW (ref 39.0–52.0)
Hemoglobin: 12.1 g/dL — ABNORMAL LOW (ref 13.0–17.0)
MCH: 29.4 pg (ref 26.0–34.0)
MCHC: 32.9 g/dL (ref 30.0–36.0)
MCV: 89.3 fL (ref 80.0–100.0)
Platelets: 315 10*3/uL (ref 150–400)
RBC: 4.12 MIL/uL — ABNORMAL LOW (ref 4.22–5.81)
RDW: 14.1 % (ref 11.5–15.5)
WBC: 10.8 10*3/uL — ABNORMAL HIGH (ref 4.0–10.5)
nRBC: 0 % (ref 0.0–0.2)

## 2018-09-30 LAB — TROPONIN I: Troponin I: <0.03 ng/mL (ref <0.03)

## 2018-09-30 LAB — LACTIC ACID, PLASMA: Lactic Acid, Venous: 0.8 mmol/L (ref 0.5–1.9)

## 2018-09-30 MED ORDER — DOXYCYCLINE HYCLATE 100 MG PO CAPS: 100.0000 mg | ORAL_CAPSULE | Freq: Two times a day (BID) | ORAL | 0 refills | Status: DC

## 2018-09-30 MED ORDER — DOXYCYCLINE HYCLATE 100 MG PO TABS
100.0000 mg | ORAL_TABLET | Freq: Once | ORAL | Status: AC
  Administered 2018-09-30: 100 mg via ORAL
  Filled 2018-09-30: qty 1

## 2018-09-30 MED ORDER — SODIUM CHLORIDE 0.9% FLUSH
3.0000 mL | Freq: Once | INTRAVENOUS | Status: AC
  Administered 2018-09-30: 3 mL via INTRAVENOUS

## 2018-09-30 MED ORDER — FUROSEMIDE 20 MG PO TABS: 20.0000 mg | ORAL_TABLET | Freq: Every day | ORAL | 0 refills | Status: AC

## 2018-09-30 MED ORDER — IOHEXOL 350 MG/ML SOLN
100.0000 mL | Freq: Once | INTRAVENOUS | Status: AC | PRN
  Administered 2018-09-30: 100 mL via INTRAVENOUS

## 2018-09-30 MED ORDER — HYDROCODONE-ACETAMINOPHEN 5-325 MG PO TABS
2.0000 | ORAL_TABLET | Freq: Once | ORAL | Status: AC
  Administered 2018-09-30: 2 via ORAL
  Filled 2018-09-30: qty 2

## 2018-09-30 MED ORDER — FUROSEMIDE 10 MG/ML IJ SOLN
20.0000 mg | Freq: Once | INTRAMUSCULAR | Status: AC
  Administered 2018-09-30: 20 mg via INTRAVENOUS
  Filled 2018-09-30: qty 2

## 2018-09-30 NOTE — ED Notes (Signed)
Patient transported to CT 

## 2018-09-30 NOTE — ED Provider Notes (Addendum)
[facility] EMERGENCY DEPARTMENT Provider Note   CSN: [id number] Arrival date & time: 09/30/18  1940    History   Chief Complaint Chief Complaint  Patient presents with   Leg Swelling   Abdominal Pain    HPI Ryan Barr is a 43 y.o. male.     Patient with history of COPD, abnormal liver function, abdominal hernia, high blood pressure, anxiety, current cigarette smoker presents with worsening bilateral leg edema and abdominal discomfort.  Patient's had chronic leg edema for almost 1 year and worsening the past 3 days with feeling edematous/distention and central abdomen.  Patient denies blood clot history, no recent surgeries, no new medications.  Patient says he did see heart doctor for work-up in the recent past and was told no findings to explain his leg edema.  Patient has no known heart failure.  Patient has shortness of breath gradually worsening over the past 6 months.     Past Medical History:  Diagnosis Date   Abnormal liver function    Anxiety    Arthritis    Chronic headaches    COPD (chronic obstructive pulmonary disease) (HCC)    Dyslipidemia    Heartburn    High cholesterol    Hypertension    Insomnia    NASH (nonalcoholic steatohepatitis)    Neuropathy    Opiate addiction ([place])    Rectal fissure     Patient Active Problem List   Diagnosis Date Noted   Chest discomfort 05/03/2018   Current every day smoker 05/03/2018   Palpitations 05/03/2018   Syncope 04/26/2018   Hyperlipidemia 04/26/2018   Intractable migraine without aura 01/26/2016   Medication overuse headache 01/26/2016   Headache, tension-type 11/05/2013   GAD (generalized anxiety disorder) 10/08/2013   Low back pain 10/08/2013   Overweight 10/08/2013    Past Surgical History:  Procedure Laterality Date   APPENDECTOMY     CHOLECYSTECTOMY     INTRAOCULAR PROSTHESES INSERTION     MYRINGOPLASTY     RECTAL SURGERY     TESTICLE REMOVAL  Left         Home Medications    Prior to Admission medications   Medication Sig Start Date End Date Taking? Authorizing Provider  ADVAIR DISKUS 250-50 MCG/DOSE AEPB INL 1 PUFF PO BID 01/31/18   [provider]  ALPRAZolam Duanne Moron) 1 MG tablet Take 1 tablet by mouth 3 (three) times daily.    [provider]  carbamazepine (CARBATROL) 200 MG 12 hr capsule Take 1 capsule by mouth 2 (two) times daily. 02/15/18   [provider]  doxycycline (VIBRAMYCIN) 100 MG capsule Take 1 capsule (100 mg total) by mouth 2 (two) times daily. One po bid x 7 days 09/30/18   Elnora Morrison, MD  furosemide (LASIX) 20 MG tablet Take 1 tablet (20 mg total) by mouth daily. 09/30/18   Elnora Morrison, MD  methocarbamol (ROBAXIN) 500 MG tablet Take 1 tablet (500 mg total) by mouth 2 (two) times daily. 10/28/17   Khatri, Hina, PA-C  omeprazole (PRILOSEC) 40 MG capsule Take 1 capsule by mouth daily. 03/02/18   [provider]  PARoxetine (PAXIL) 20 MG tablet Take 1 tablet by mouth daily. 02/14/18   [provider]  pregabalin (LYRICA) 50 MG capsule Take 1 capsule by mouth 3 (three) times daily.    [provider]  QUEtiapine (SEROQUEL) 100 MG tablet Take 1 tablet by mouth at bedtime.    [provider]  rosuvastatin (CRESTOR) 10 MG  tablet Take 1 tablet by mouth daily. 02/14/18   [provider]  SUBOXONE 8-2 MG FILM DISSOLVE 2 [place] DAILY 03/12/18   [provider]  sucralfate (CARAFATE) 1 g tablet Take 1 tablet by mouth as needed. 02/19/18   [provider]  SUMAtriptan (IMITREX) 100 MG tablet Take 1 tablet by mouth as needed. 01/26/16   [provider]    Family History Family History  Problem Relation Age of Onset   Arthritis Mother    Hypertension Mother    Cancer Father    Arthritis Father    Sickle cell anemia Father    Hypothyroidism Sister    Diabetes Sister    Arthritis Brother     Hyperlipidemia Maternal Grandmother    Cancer Maternal Grandmother    Diabetes Maternal Grandmother    Seizures Maternal Grandmother    Arthritis Maternal Grandmother    Hypertension Maternal Grandmother    Heart attack Maternal Grandmother    Hyperlipidemia Maternal Grandfather    Cancer Maternal Grandfather    Diabetes Maternal Grandfather    Seizures Maternal Grandfather    Arthritis Maternal Grandfather    Hypertension Maternal Grandfather    Heart attack Maternal Grandfather    Hyperlipidemia Paternal Grandmother    Cancer Paternal Grandmother    Arthritis Paternal Grandmother    Hypertension Paternal Grandmother    Hyperlipidemia Paternal Grandfather    Cancer Paternal Grandfather    Arthritis Paternal Grandfather    Hypertension Paternal Grandfather     Social History Social History   Tobacco Use   Smoking status: Current Every Day Smoker   Smokeless tobacco: Never Used  Substance Use Topics   Alcohol use: Yes    Comment: occ   Drug use: Never     Allergies   Varenicline; Cymbalta [duloxetine hcl]; Mobic [meloxicam]; Nortriptyline; Toradol [ketorolac tromethamine]; and Tramadol   Review of Systems Review of Systems  Constitutional: Negative for chills and fever.  HENT: Negative for congestion.   Eyes: Negative for visual disturbance.  Respiratory: Positive for shortness of breath.   Cardiovascular: Positive for leg swelling. Negative for chest pain.  Gastrointestinal: Positive for abdominal pain. Negative for vomiting.  Genitourinary: Negative for dysuria and flank pain.  Musculoskeletal: Negative for back pain, neck pain and neck stiffness.  Skin: Negative for rash.  Neurological: Negative for light-headedness and headaches.     Physical Exam Updated Vital Signs BP (!) 143/74    Pulse (!) 114    Temp 98.1 F (36.7 C) (Oral)    Resp 17    Ht 5' 8"  (1.727 m)    Wt 101.6 kg    SpO2 93%    BMI 34.06 kg/m   Physical  Exam Vitals signs and nursing note reviewed.  Constitutional:      Appearance: He is well-developed.  HENT:     Head: Normocephalic and atraumatic.  Eyes:     General:        Right eye: No discharge.        Left eye: No discharge.     Conjunctiva/sclera: Conjunctivae normal.  Neck:     Musculoskeletal: Normal range of motion and neck supple.     Trachea: No tracheal deviation.  Cardiovascular:     Rate and Rhythm: Regular rhythm. Tachycardia present.  Pulmonary:     Effort: Pulmonary effort is normal.     Breath sounds: Normal breath sounds.  Abdominal:     General: There is distension.  Palpations: Abdomen is soft.     Tenderness: There is abdominal tenderness (central w mild reducable hernia). There is no guarding.     Hernia: A hernia is present. Hernia is present in the ventral area.  Skin:    General: Skin is warm.     Findings: No rash.     Comments: 2+ edema bilateral LEs  Neurological:     Mental Status: He is alert and oriented to person, place, and time.  Psychiatric:        Mood and Affect: Mood normal.      ED Treatments / Results  Labs (all labs ordered are listed, but only abnormal results are displayed) Labs Reviewed  COMPREHENSIVE METABOLIC PANEL - Abnormal; Notable for the following components:      Result Value   Alkaline Phosphatase 151 (*)    All other components within normal limits  CBC - Abnormal; Notable for the following components:   WBC 10.8 (*)    RBC 4.12 (*)    Hemoglobin 12.1 (*)    HCT 36.8 (*)    All other components within normal limits  URINALYSIS, ROUTINE W REFLEX MICROSCOPIC - Abnormal; Notable for the following components:   Leukocytes,Ua TRACE (*)    All other components within normal limits  SARS CORONAVIRUS 2 (HOSPITAL ORDER, [facility])  LIPASE, BLOOD  TROPONIN I  LACTIC ACID, PLASMA  BRAIN NATRIURETIC PEPTIDE  LACTIC ACID, PLASMA    EKG EKG Interpretation  Date/Time:  Sunday September 30 2018 22:03:53 EDT Ventricular Rate:  125 PR Interval:    QRS Duration: 106 QT Interval:  315 QTC Calculation: 455 R Axis:   98 Text Interpretation:  Sinus tachycardia Consider right ventricular hypertrophy Confirmed by Elnora Morrison [phone]) on 09/30/2018 11:23:22 PM   Radiology Ct Angio Chest Pe W And/or Wo Contrast  Result Date: 09/30/2018 CLINICAL DATA:  43 year old male with history of left leg swelling and increasing abdominal swelling. Abnormal liver function. EXAM: CT ANGIOGRAPHY CHEST CT ABDOMEN AND PELVIS WITH CONTRAST TECHNIQUE: Multidetector CT imaging of the chest was performed using the standard protocol during bolus administration of intravenous contrast. Multiplanar CT image reconstructions and MIPs were obtained to evaluate the vascular anatomy. Multidetector CT imaging of the abdomen and pelvis was performed using the standard protocol during bolus administration of intravenous contrast. CONTRAST:  162m OMNIPAQUE IOHEXOL 350 MG/ML SOLN COMPARISON:  Chest CT 03/17/2018 FINDINGS: CTA CHEST FINDINGS Cardiovascular: No filling defects within the pulmonary arterial tree to suggest underlying pulmonary embolism. Heart size is normal. There is no significant pericardial fluid, thickening or pericardial calcification. No atherosclerotic calcifications in the thoracic aorta or the coronary arteries. Mediastinum/Nodes: No pathologically enlarged mediastinal or hilar lymph nodes. Esophagus is unremarkable in appearance. No axillary lymphadenopathy. Lungs/Pleura: Patchy multifocal ill-defined areas of ground-glass attenuation scattered throughout the lungs bilaterally, concerning for atypical infection such as viral pneumonia. No pleural effusions. No suspicious appearing pulmonary nodules or masses are noted. Musculoskeletal: There are no aggressive appearing lytic or blastic lesions noted in the visualized portions of the skeleton. Review of the MIP images confirms the above findings. CT ABDOMEN  and PELVIS FINDINGS Hepatobiliary: No suspicious cystic or solid hepatic lesions. No intra or extrahepatic biliary ductal dilatation. Status post cholecystectomy. Pancreas: No pancreatic mass. No pancreatic ductal dilatation. No pancreatic or peripancreatic fluid or inflammatory changes. Spleen: Unremarkable. Adrenals/Urinary Tract: Right kidney and bilateral adrenal glands are normal in appearance. 1.4 cm low-intermediate attenuation lesion in the medial aspect of  the interpolar region of the left kidney, too small to definitively characterize, but favored to represent a mildly proteinaceous cyst, similar to the prior examination. No hydroureteronephrosis. Urinary bladder is normal in appearance. Stomach/Bowel: Normal appearance of the stomach. No pathologic dilatation of small bowel or colon. Status post appendectomy. Vascular/Lymphatic: Aortic atherosclerosis, without evidence of aneurysm or dissection in the abdominal or pelvic vasculature. No lymphadenopathy noted in the abdomen or pelvis. Reproductive: Prostate gland and seminal vesicles are unremarkable. Other: No significant volume of ascites.  No pneumoperitoneum. Musculoskeletal: There are no aggressive appearing lytic or blastic lesions noted in the visualized portions of the skeleton. Review of the MIP images confirms the above findings. IMPRESSION: 1. No evidence of pulmonary embolism. However, there are a spectrum of findings in the lungs which can be seen with acute atypical infection (as well as other non-infectious etiologies). In particular, viral pneumonia (including COVID-19) should be considered in the appropriate clinical setting. 2. No acute findings noted in the abdomen or pelvis to account for the patient's symptoms. 3. Aortic atherosclerosis. 4. Additional incidental findings, as above. Electronically Signed   By: Vinnie Langton M.D.   On: 09/30/2018 22:51   Ct Abdomen Pelvis W Contrast  Result Date: 09/30/2018 CLINICAL DATA:   43 year old male with history of left leg swelling and increasing abdominal swelling. Abnormal liver function. EXAM: CT ANGIOGRAPHY CHEST CT ABDOMEN AND PELVIS WITH CONTRAST TECHNIQUE: Multidetector CT imaging of the chest was performed using the standard protocol during bolus administration of intravenous contrast. Multiplanar CT image reconstructions and MIPs were obtained to evaluate the vascular anatomy. Multidetector CT imaging of the abdomen and pelvis was performed using the standard protocol during bolus administration of intravenous contrast. CONTRAST:  163m OMNIPAQUE IOHEXOL 350 MG/ML SOLN COMPARISON:  Chest CT 03/17/2018 FINDINGS: CTA CHEST FINDINGS Cardiovascular: No filling defects within the pulmonary arterial tree to suggest underlying pulmonary embolism. Heart size is normal. There is no significant pericardial fluid, thickening or pericardial calcification. No atherosclerotic calcifications in the thoracic aorta or the coronary arteries. Mediastinum/Nodes: No pathologically enlarged mediastinal or hilar lymph nodes. Esophagus is unremarkable in appearance. No axillary lymphadenopathy. Lungs/Pleura: Patchy multifocal ill-defined areas of ground-glass attenuation scattered throughout the lungs bilaterally, concerning for atypical infection such as viral pneumonia. No pleural effusions. No suspicious appearing pulmonary nodules or masses are noted. Musculoskeletal: There are no aggressive appearing lytic or blastic lesions noted in the visualized portions of the skeleton. Review of the MIP images confirms the above findings. CT ABDOMEN and PELVIS FINDINGS Hepatobiliary: No suspicious cystic or solid hepatic lesions. No intra or extrahepatic biliary ductal dilatation. Status post cholecystectomy. Pancreas: No pancreatic mass. No pancreatic ductal dilatation. No pancreatic or peripancreatic fluid or inflammatory changes. Spleen: Unremarkable. Adrenals/Urinary Tract: Right kidney and bilateral adrenal  glands are normal in appearance. 1.4 cm low-intermediate attenuation lesion in the medial aspect of the interpolar region of the left kidney, too small to definitively characterize, but favored to represent a mildly proteinaceous cyst, similar to the prior examination. No hydroureteronephrosis. Urinary bladder is normal in appearance. Stomach/Bowel: Normal appearance of the stomach. No pathologic dilatation of small bowel or colon. Status post appendectomy. Vascular/Lymphatic: Aortic atherosclerosis, without evidence of aneurysm or dissection in the abdominal or pelvic vasculature. No lymphadenopathy noted in the abdomen or pelvis. Reproductive: Prostate gland and seminal vesicles are unremarkable. Other: No significant volume of ascites.  No pneumoperitoneum. Musculoskeletal: There are no aggressive appearing lytic or blastic lesions noted in the visualized portions of the skeleton. Review of  the MIP images confirms the above findings. IMPRESSION: 1. No evidence of pulmonary embolism. However, there are a spectrum of findings in the lungs which can be seen with acute atypical infection (as well as other non-infectious etiologies). In particular, viral pneumonia (including COVID-19) should be considered in the appropriate clinical setting. 2. No acute findings noted in the abdomen or pelvis to account for the patient's symptoms. 3. Aortic atherosclerosis. 4. Additional incidental findings, as above. Electronically Signed   By: Vinnie Langton M.D.   On: 09/30/2018 22:51   Dg Chest Portable 1 View  Result Date: 09/30/2018 CLINICAL DATA:  43 year old male with shortness of breath. COVID-19 status pending. EXAM: PORTABLE CHEST 1 VIEW COMPARISON:  08/29/2018 and earlier. FINDINGS: Portable AP semi upright view at 2136 hours. Lung volumes and mediastinal contours remain within normal limits. The patient is mildly rotated to the right today. Visualized tracheal air column is within normal limits. Allowing for portable  technique the lungs are clear. Negative visible bowel gas pattern and osseous structures. IMPRESSION: Negative portable chest. Electronically Signed   By: Genevie Ann M.D.   On: 09/30/2018 21:43    Procedures Procedures (including critical care time)  Medications Ordered in ED Medications  doxycycline (VIBRA-TABS) tablet 100 mg (has no administration in time range)  furosemide (LASIX) injection 20 mg (has no administration in time range)  sodium chloride flush (NS) 0.9 % injection 3 mL (3 mLs Intravenous Given 09/30/18 2216)  iohexol (OMNIPAQUE) 350 MG/ML injection 100 mL (100 mLs Intravenous Contrast Given 09/30/18 2221)  HYDROcodone-acetaminophen (NORCO/VICODIN) 5-325 MG per tablet 2 tablet (2 tablets Oral Given 09/30/18 2305)     Initial Impression / Assessment and Plan / ED Course  I have reviewed the triage vital signs and the nursing notes.  Pertinent labs & imaging results that were available during my care of the patient were reviewed by me and considered in my medical decision making (see chart for details).       Patient presents with worsening leg edema and abdominal symptoms.  Patient has significant sinus tachycardia on exam 130. Concern for possible heart failure versus blood clot versus acute on chronic.  Patient also has liver disease.    Patient CT scan results reviewed no blood clot, possible pneumonia.  COVID test pending.  Patient has shortness of breath but not requiring oxygen.  Patient had appointment with cardiology recently and has primary care doctor follow-up.  Discussed results in detail.  BNP and troponin COVID test will be signed out to follow-up results.  Discussed follow-up in detail with patient.  Plan to give 5 days of antibiotics and Lasix and patient will follow-up later this week.  Results and differential diagnosis were discussed with the patient/parent/guardian. Xrays were independently reviewed by myself.  Close follow up outpatient was discussed,  comfortable with the plan.   Medications  doxycycline (VIBRA-TABS) tablet 100 mg (has no administration in time range)  furosemide (LASIX) injection 20 mg (has no administration in time range)  sodium chloride flush (NS) 0.9 % injection 3 mL (3 mLs Intravenous Given 09/30/18 2216)  iohexol (OMNIPAQUE) 350 MG/ML injection 100 mL (100 mLs Intravenous Contrast Given 09/30/18 2221)  HYDROcodone-acetaminophen (NORCO/VICODIN) 5-325 MG per tablet 2 tablet (2 tablets Oral Given 09/30/18 2305)    Vitals:   09/30/18 2145 09/30/18 2200 09/30/18 2215 09/30/18 2300  BP: (!) 141/89 (!) 143/79 (!) 144/80 (!) 143/74  Pulse: (!) 123 (!) 118 (!) 128 (!) 114  Resp: 20 (!) 21 15 17  Temp:      TempSrc:      SpO2: 96% 95% 97% 93%  Weight:      Height:        Final diagnoses:  Bilateral leg edema  Dyspnea on exertion    Final Clinical Impressions(s) / ED Diagnoses   Final diagnoses:  Bilateral leg edema  Dyspnea on exertion    ED Discharge Orders         Ordered    furosemide (LASIX) 20 MG tablet  Daily     09/30/18 2321    doxycycline (VIBRAMYCIN) 100 MG capsule  2 times daily     09/30/18 2321           Elnora Morrison, MD 09/30/18 2322    Elnora Morrison, MD 09/30/18 2323

## 2018-09-30 NOTE — ED Notes (Signed)
Treyvion Durkee (son) [phone]

## 2018-09-30 NOTE — ED Triage Notes (Signed)
Pt reports left leg swelling and increased abd swelling, hx of hiatal hernia. Pt tachycardic in triage (130s). Denies n/v/d.

## 2018-10-01 NOTE — ED Notes (Signed)
Discharge instructions discussed with pt. Pt verbalized understanding. Pt no questions at this time

## 2019-01-24 ENCOUNTER — Encounter: Payer: Self-pay | Admitting: Cardiology

## 2019-01-24 ENCOUNTER — Ambulatory Visit (INDEPENDENT_AMBULATORY_CARE_PROVIDER_SITE_OTHER): Payer: Medicaid Other | Admitting: Cardiology

## 2019-01-24 ENCOUNTER — Other Ambulatory Visit: Payer: Self-pay

## 2019-01-24 VITALS — BP 110/70 | HR 77 | Ht 68.0 in | Wt 225.0 lb

## 2019-01-24 DIAGNOSIS — R55 Syncope and collapse: Secondary | ICD-10-CM | POA: Diagnosis not present

## 2019-01-24 DIAGNOSIS — R002 Palpitations: Secondary | ICD-10-CM

## 2019-01-24 DIAGNOSIS — F411 Generalized anxiety disorder: Secondary | ICD-10-CM

## 2019-01-24 DIAGNOSIS — F17200 Nicotine dependence, unspecified, uncomplicated: Secondary | ICD-10-CM | POA: Diagnosis not present

## 2019-01-24 NOTE — Patient Instructions (Signed)

## 2019-01-24 NOTE — Progress Notes (Signed)
Cardiology Office Note:    Date:  01/24/2019   ID:  Ryan Barr, DOB [birth year], MRN [id number]  PCP:  Janine Limbo, PA-C  Cardiologist:  Jenean Lindau, MD   Referring MD: Janine Limbo, PA-C    ASSESSMENT:    1. Syncope, unspecified syncope type   2. Palpitations   3. GAD (generalized anxiety disorder)   4. Current every day smoker    PLAN:    In order of problems listed above:  1. Chest discomfort: This has resolved.  I discussed my findings with the patient at extensive length and primary prevention stressed.  Importance of compliance with diet and medication stressed. 2. Cigarette smoking: I spent 5 minutes with the patient discussing solely about smoking. Smoking cessation was counseled. I suggested to the patient also different medications and pharmacological interventions. Patient is keen to try stopping on its own at this time. He will get back to me if he needs any further assistance in this matter. 3. Patient will be seen in follow-up appointment in 6 months or earlier if the patient has any concerns    Medication Adjustments/Labs and Tests Ordered: Current medicines are reviewed at length with the patient today.  Concerns regarding medicines are outlined above.  No orders of the defined types were placed in this encounter.  No orders of the defined types were placed in this encounter.    Chief Complaint  Patient presents with  . Follow-up     History of Present Illness:    Ryan Barr is a 43 y.o. male.  Patient was evaluated for chest discomfort.  He mentions to me that he has significant liver issues and subsequently it was felt that his enlarging liver is pushing on his chest and causing him chest discomfort.  He underwent multiple testing which was unremarkable and these are mentioned below.  Currently his chest discomfort has resolved some.  He tries to ambulate to the best of his ability.  Unfortunately continues to smoke.  Past Medical History:   Diagnosis Date  . Abnormal liver function   . Anxiety   . Arthritis   . Chronic headaches   . COPD (chronic obstructive pulmonary disease) ([place])   . Dyslipidemia   . Heartburn   . High cholesterol   . Hypertension   . Insomnia   . NASH (nonalcoholic steatohepatitis)   . Neuropathy   . Opiate addiction ([place])   . Rectal fissure     Past Surgical History:  Procedure Laterality Date  . APPENDECTOMY    . CHOLECYSTECTOMY    . INTRAOCULAR PROSTHESES INSERTION    . MYRINGOPLASTY    . RECTAL SURGERY    . TESTICLE REMOVAL Left     Current Medications: Current Meds  Medication Sig  . ADVAIR DISKUS 250-50 MCG/DOSE AEPB INL 1 PUFF PO BID  . albuterol (VENTOLIN HFA) 108 (90 Base) MCG/ACT inhaler INHALE 1 2 PUFF EVERY FOUR SIX HOURS AS NEEDED  . ALPRAZolam (XANAX) 1 MG tablet Take 1 tablet by mouth at bedtime as needed.   . furosemide (LASIX) 20 MG tablet Take 1 tablet (20 mg total) by mouth daily.  . methocarbamol (ROBAXIN) 500 MG tablet Take 1 tablet (500 mg total) by mouth 2 (two) times daily.  Marland Kitchen omeprazole (PRILOSEC) 40 MG capsule Take 1 capsule by mouth daily.  Marland Kitchen PARoxetine (PAXIL) 20 MG tablet Take 1 tablet by mouth daily.  . QUEtiapine (SEROQUEL) 100 MG tablet Take 1 tablet by mouth at bedtime.  . rosuvastatin (  CRESTOR) 10 MG tablet Take 1 tablet by mouth daily.  . SUBOXONE 8-2 MG FILM DISSOLVE 2 FLIM UNDER TONGUE DAILY  . sucralfate (CARAFATE) 1 g tablet Take 1 tablet by mouth as needed.  . SUMAtriptan (IMITREX) 100 MG tablet Take 1 tablet by mouth as needed.  . tamsulosin (FLOMAX) 0.4 MG CAPS capsule TAKE 1 CAPSULE BY MOUTH EVERY DAY 30MIN AFTER A MEAL     Allergies:   Varenicline, Cymbalta [duloxetine hcl], Lyrica [pregabalin], Mobic [meloxicam], Nortriptyline, Toradol [ketorolac tromethamine], and Tramadol   Social History   Socioeconomic History  . Marital status: Married    Spouse name: Not on file  . Number of children: Not on file  . Years of education: Not on  file  . Highest education level: Not on file  Occupational History  . Not on file  Social Needs  . Financial resource strain: Not on file  . Food insecurity    Worry: Not on file    Inability: Not on file  . Transportation needs    Medical: Not on file    Non-medical: Not on file  Tobacco Use  . Smoking status: Current Every Day Smoker  . Smokeless tobacco: Never Used  Substance and Sexual Activity  . Alcohol use: Yes    Comment: occ  . Drug use: Never  . Sexual activity: Yes  Lifestyle  . Physical activity    Days per week: Not on file    Minutes per session: Not on file  . Stress: Not on file  Relationships  . Social Herbalist on phone: Not on file    Gets together: Not on file    Attends religious service: Not on file    Active member of club or organization: Not on file    Attends meetings of clubs or organizations: Not on file    Relationship status: Not on file  Other Topics Concern  . Not on file  Social History Narrative  . Not on file     Family History: The patient's family history includes Arthritis in his brother, father, maternal grandfather, maternal grandmother, mother, paternal grandfather, and paternal grandmother; Cancer in his father, maternal grandfather, maternal grandmother, paternal grandfather, and paternal grandmother; Diabetes in his maternal grandfather, maternal grandmother, and sister; Heart attack in his maternal grandfather and maternal grandmother; Hyperlipidemia in his maternal grandfather, maternal grandmother, paternal grandfather, and paternal grandmother; Hypertension in his maternal grandfather, maternal grandmother, mother, paternal grandfather, and paternal grandmother; Hypothyroidism in his sister; Seizures in his maternal grandfather and maternal grandmother; Sickle cell anemia in his father.  ROS:   Please see the history of present illness.    All other systems reviewed and are negative.  EKGs/Labs/Other Studies  Reviewed:    The following studies were reviewed today: Study Highlights   The left ventricular ejection fraction is hyperdynamic (>65%).  Nuclear stress EF: 68%.  There was no ST segment deviation noted during stress.  The study is normal.  This is a low risk study.   IMPRESSIONS    1. The left ventricle has normal systolic function with an ejection fraction of 60-65%. The cavity size was normal. Left ventricular diastolic parameters were normal No evidence of left ventricular regional wall motion abnormalities.  2. The right ventricle has normal systolic function. The cavity was normal. There is no increase in right ventricular wall thickness.  3. The ascending aorta, aortic root and aortic arch are normal in size and structure.  4. No  evidence of left ventricular regional wall motion abnormalities.  5. The aortic valve is tricuspid.  6. The mitral valve is normal in structure.  EVENT MONITOR REPORT:   Patient was monitored from 05/25/2018 to 06/23/2018. Indication:                    Palpitations Ordering physician:  Jenean Lindau, MD  Referring physician:        Jenean Lindau, MD    Baseline rhythm: Sinus  Minimum heart rate: 50 BPM. Maximal heart rate 150 BPM.  Atrial arrhythmia: None significant  Ventricular arrhythmia: None significant  Conduction abnormality: None significant  Symptoms: Lightheadedness, dizziness and shortness of breath   Conclusion:  Normal event monitor.  Symptoms did not correlate with any findings.  Interpreting  cardiologist: Jenean Lindau, MD  Date: 06/28/2018 12:08 PM    Recent Labs: 09/30/2018: ALT 25; B Natriuretic Peptide 53.3; BUN 10; Creatinine, Ser 0.75; Hemoglobin 12.1; Platelets 315; Potassium 3.7; Sodium 136  Recent Lipid Panel No results found for: CHOL, TRIG, HDL, CHOLHDL, VLDL, LDLCALC, LDLDIRECT  Physical Exam:    VS:  BP 110/70 (BP Location: Right Arm, Patient Position: Sitting, Cuff Size:  Normal)   Pulse 77   Ht 5' 8"  (1.727 m)   Wt 225 lb (102.1 kg)   SpO2 96%   BMI 34.21 kg/m     Wt Readings from Last 3 Encounters:  01/24/19 225 lb (102.1 kg)  09/30/18 224 lb (101.6 kg)  06/20/18 220 lb (99.8 kg)     GEN: Patient is in no acute distress HEENT: Normal NECK: No JVD; No carotid bruits LYMPHATICS: No lymphadenopathy CARDIAC: Hear sounds regular, 2/6 systolic murmur at the apex. RESPIRATORY:  Clear to auscultation without rales, wheezing or rhonchi  ABDOMEN: Soft, non-tender, non-distended MUSCULOSKELETAL:  No edema; No deformity  SKIN: Warm and dry NEUROLOGIC:  Alert and oriented x 3 PSYCHIATRIC:  Normal affect   Signed, Jenean Lindau, MD  01/24/2019 2:01 PM    [place]

## 2019-04-27 ENCOUNTER — Encounter (HOSPITAL_COMMUNITY): Payer: Self-pay | Admitting: Emergency Medicine

## 2019-04-27 ENCOUNTER — Other Ambulatory Visit: Payer: Self-pay

## 2019-04-27 ENCOUNTER — Emergency Department (HOSPITAL_COMMUNITY)
Admission: EM | Admit: 2019-04-27 | Discharge: 2019-04-28 | Discharge status: Against Medical Advice | Payer: Medicaid Other | Attending: Emergency Medicine | Admitting: Emergency Medicine

## 2019-04-27 DIAGNOSIS — Z5321 Procedure and treatment not carried out due to patient leaving prior to being seen by health care provider: Secondary | ICD-10-CM | POA: Insufficient documentation

## 2019-04-27 DIAGNOSIS — R109 Unspecified abdominal pain: Secondary | ICD-10-CM | POA: Diagnosis present

## 2019-04-27 LAB — COMPREHENSIVE METABOLIC PANEL
ALT: 60 U/L — ABNORMAL HIGH (ref 0–44)
AST: 49 U/L — ABNORMAL HIGH (ref 15–41)
Albumin: 3.9 g/dL (ref 3.5–5.0)
Alkaline Phosphatase: 151 U/L — ABNORMAL HIGH (ref 38–126)
Anion gap: 9 (ref 5–15)
BUN: 13 mg/dL (ref 6–20)
CO2: 24 mmol/L (ref 22–32)
Calcium: 9.2 mg/dL (ref 8.9–10.3)
Chloride: 103 mmol/L (ref 98–111)
Creatinine, Ser: 0.86 mg/dL (ref 0.61–1.24)
GFR calc Af Amer: >60 mL/min (ref >60)
GFR calc non Af Amer: >60 mL/min (ref >60)
Glucose, Bld: 128 mg/dL — ABNORMAL HIGH (ref 70–99)
Potassium: 4.1 mmol/L (ref 3.5–5.1)
Sodium: 136 mmol/L (ref 135–145)
Total Bilirubin: 0.4 mg/dL (ref 0.3–1.2)
Total Protein: 7 g/dL (ref 6.5–8.1)

## 2019-04-27 LAB — CBC
HCT: 38.6 % — ABNORMAL LOW (ref 39.0–52.0)
Hemoglobin: 12.4 g/dL — ABNORMAL LOW (ref 13.0–17.0)
MCH: 29.2 pg (ref 26.0–34.0)
MCHC: 32.1 g/dL (ref 30.0–36.0)
MCV: 90.8 fL (ref 80.0–100.0)
Platelets: 298 10*3/uL (ref 150–400)
RBC: 4.25 MIL/uL (ref 4.22–5.81)
RDW: 14.3 % (ref 11.5–15.5)
WBC: 8.1 10*3/uL (ref 4.0–10.5)
nRBC: 0 % (ref 0.0–0.2)

## 2019-04-27 LAB — URINALYSIS, ROUTINE W REFLEX MICROSCOPIC
Bilirubin Urine: NEGATIVE
Glucose, UA: NEGATIVE mg/dL
Hgb urine dipstick: NEGATIVE
Ketones, ur: NEGATIVE mg/dL
Leukocytes,Ua: NEGATIVE
Nitrite: NEGATIVE
Protein, ur: NEGATIVE mg/dL
Specific Gravity, Urine: 1.02 (ref 1.005–1.030)
pH: 5 (ref 5.0–8.0)

## 2019-04-27 LAB — LIPASE, BLOOD: Lipase: 18 U/L (ref 11–51)

## 2019-04-27 NOTE — ED Triage Notes (Signed)
Pt endorses abd pain and swelling for 6 months. States the pain is worse at night. Endorses weakness and lethargy. Reports he quit drinking a year ago due to abnormal liver tests.

## 2019-04-27 NOTE — ED Notes (Signed)
Pt did not answer for vitals check x2

## 2019-04-27 NOTE — ED Notes (Signed)
Pt did not answer. For vitals

## 2019-04-27 NOTE — ED Notes (Signed)
Pt did not answer for recheck of vitals.

## 2019-05-17 ENCOUNTER — Telehealth (HOSPITAL_COMMUNITY): Payer: Self-pay

## 2019-05-17 NOTE — Telephone Encounter (Signed)

## 2019-05-20 ENCOUNTER — Encounter: Payer: Medicaid Other | Admitting: Surgery

## 2019-05-20 ENCOUNTER — Encounter (HOSPITAL_COMMUNITY): Payer: Medicaid Other

## 2019-05-20 ENCOUNTER — Other Ambulatory Visit: Payer: Self-pay

## 2019-05-20 DIAGNOSIS — R600 Localized edema: Secondary | ICD-10-CM

## 2019-09-24 DEATH — deceased

## 2020-04-16 DIAGNOSIS — Z0289 Encounter for other administrative examinations: Secondary | ICD-10-CM

## 2020-09-06 IMAGING — DX PORTABLE CHEST - 1 VIEW
1 series · 1 of 1 positions shown · non-contrast
Comparison: 08/29/2018 and earlier.

CLINICAL DATA: 43-year-old male with shortness of breath. [id number]
status pending.

EXAM:
PORTABLE CHEST 1 VIEW

[chest ap]
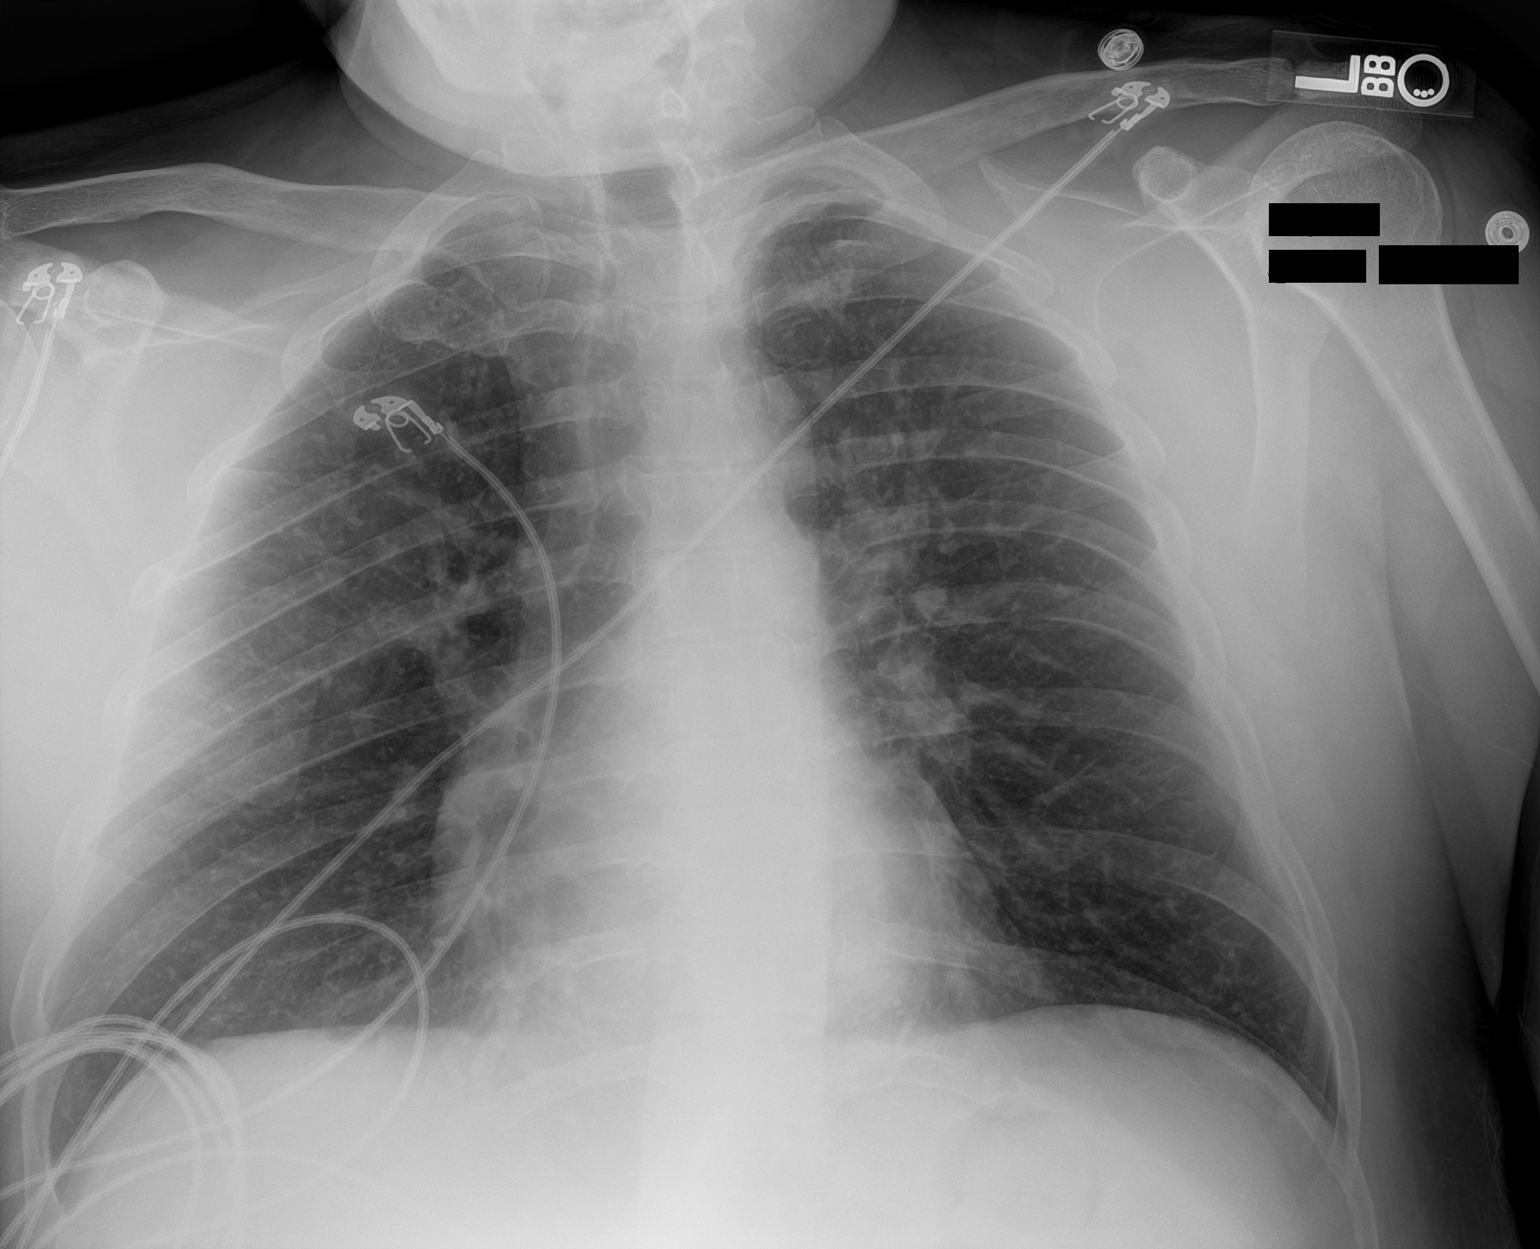

[1 of 1 positions shown; findings below may reference images not displayed]

FINDINGS: Portable AP semi upright view at 7715 hours. Lung volumes and
mediastinal contours remain within normal limits. The patient is
mildly rotated to the right today. Visualized tracheal air column is
within normal limits. Allowing for portable technique the lungs are
clear. Negative visible bowel gas pattern and osseous structures.
IMPRESSION: Negative portable chest.

## 2020-09-06 IMAGING — CT CT ANGIOGRAPHY CHEST
3 of 13 series · 17 of 46 positions shown · IV contrast (omnipaque)
Comparison: Chest CT 03/17/2018

CLINICAL DATA: 43-year-old male with history of left leg swelling
and increasing abdominal swelling. Abnormal liver function.

EXAM:
CT ANGIOGRAPHY CHEST
CT ABDOMEN AND PELVIS WITH CONTRAST
TECHNIQUE: Multidetector CT imaging of the chest was performed using the
standard protocol during bolus administration of intravenous
contrast. Multiplanar CT image reconstructions and MIPs were
obtained to evaluate the vascular anatomy. Multidetector CT imaging
of the abdomen and pelvis was performed using the standard protocol
during bolus administration of intravenous contrast.
CONTRAST:  100mL OMNIPAQUE IOHEXOL 350 MG/ML SOLN

[Series 6: thins · axial · 0.92mm/px · z∈[+1146,+1372]mm · 14 of 262 slices shown]
[im 18/262  lung]
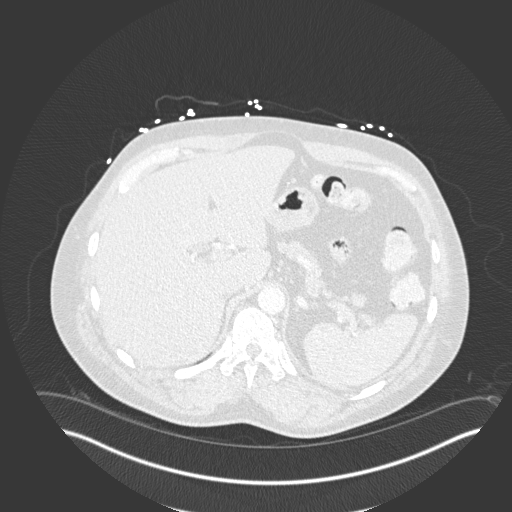
[im 35/262  soft-tissue]
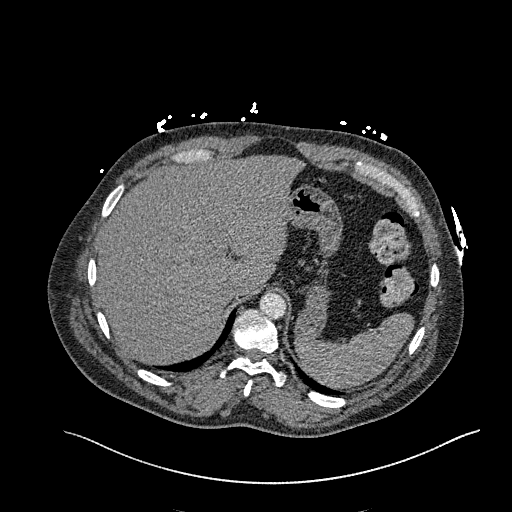
[im 53/262  lung]
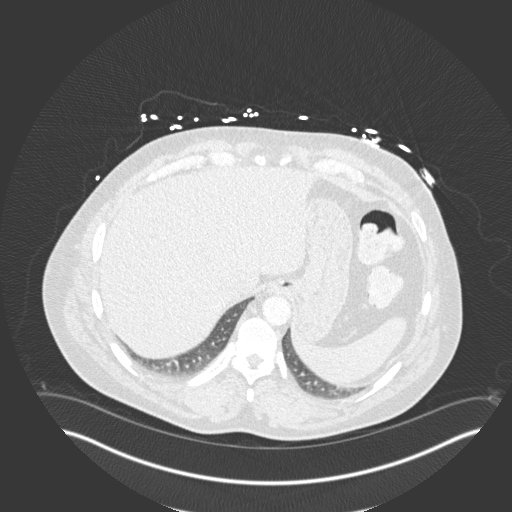
[im 70/262  soft-tissue]
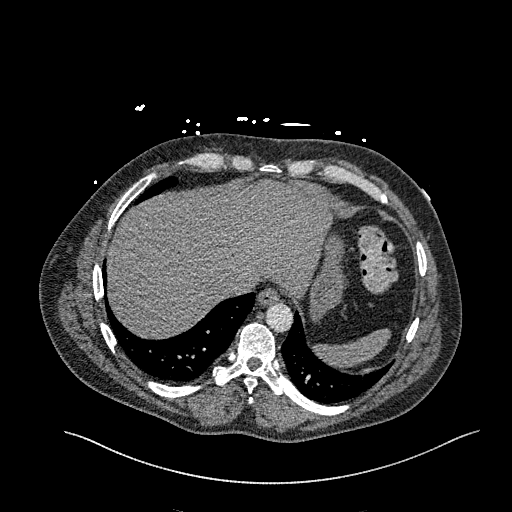
[im 88/262  lung]
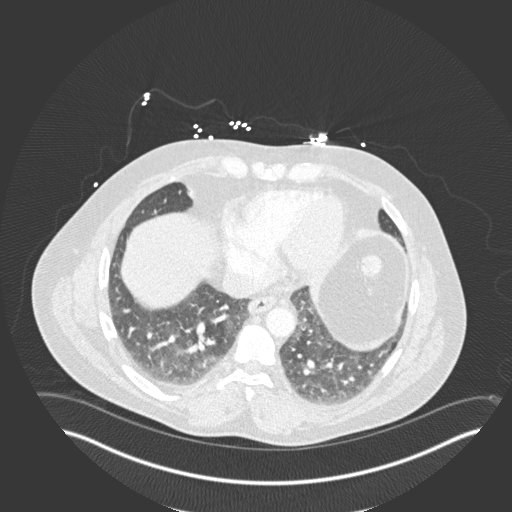
[im 105/262  soft-tissue]
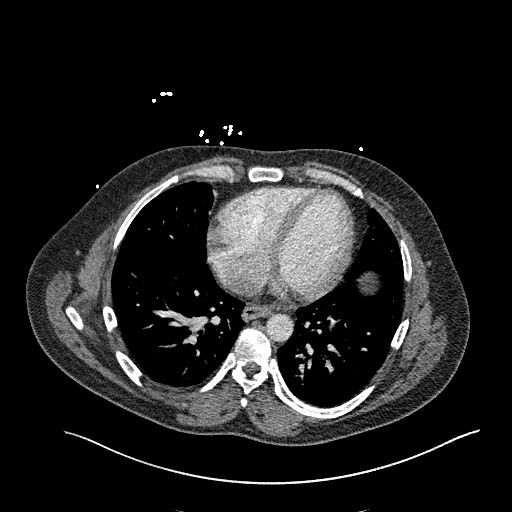
[im 122/262  lung]
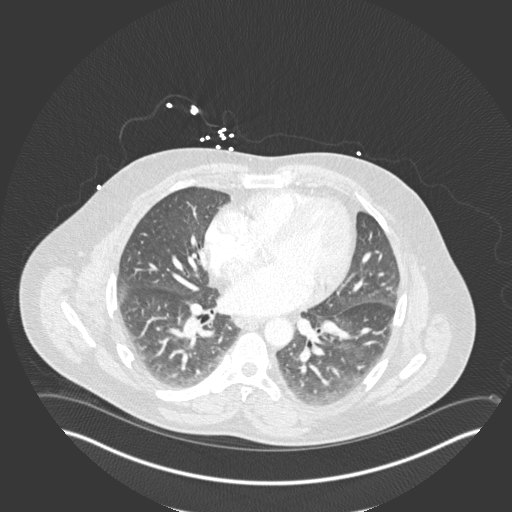
[im 140/262  soft-tissue]
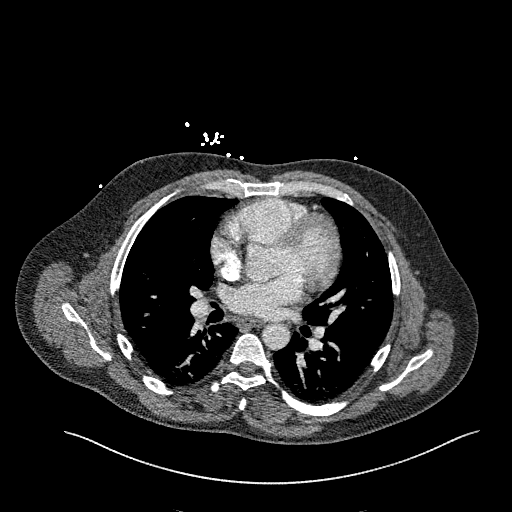
[im 157/262  lung]
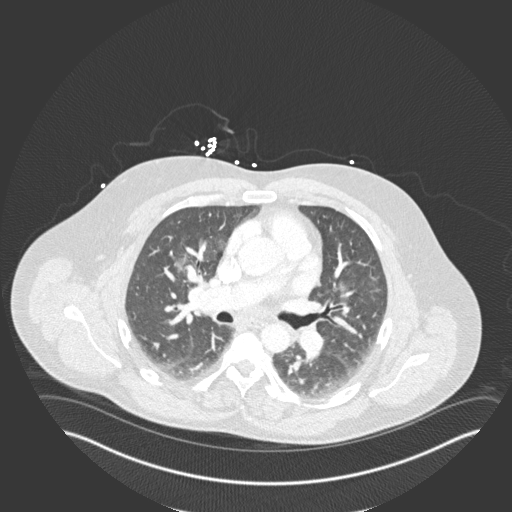
[im 175/262  soft-tissue]
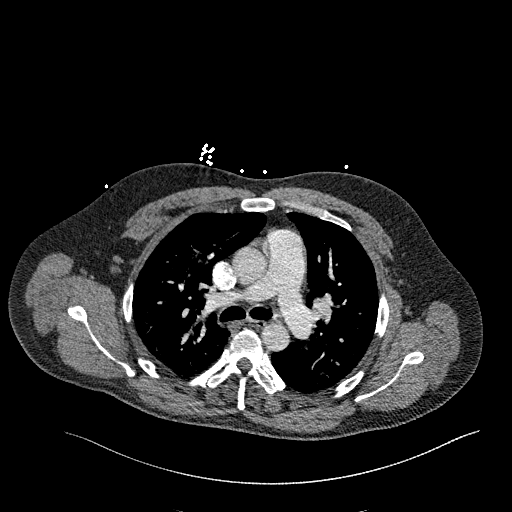
[im 192/262  lung]
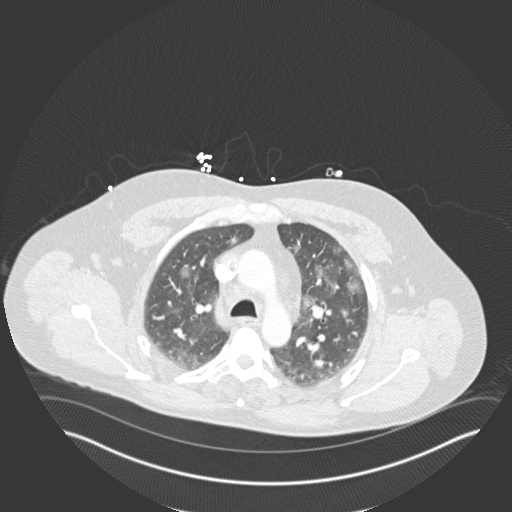
[im 209/262  soft-tissue]
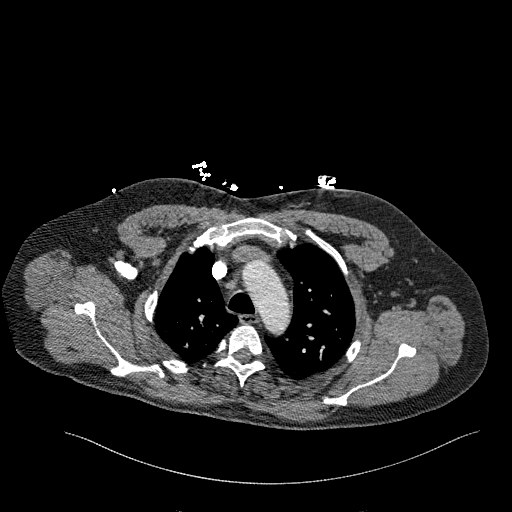
[im 227/262  lung]
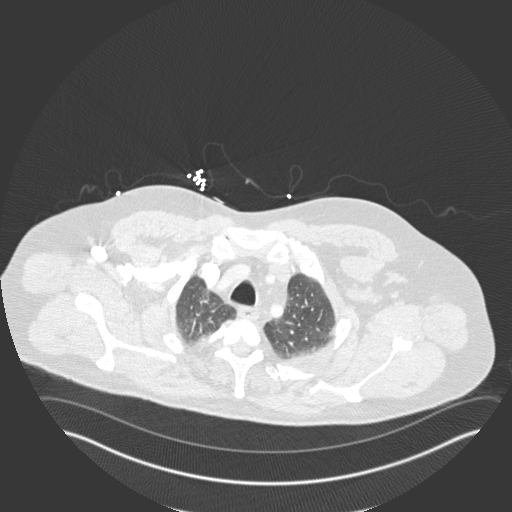
[im 244/262  soft-tissue]
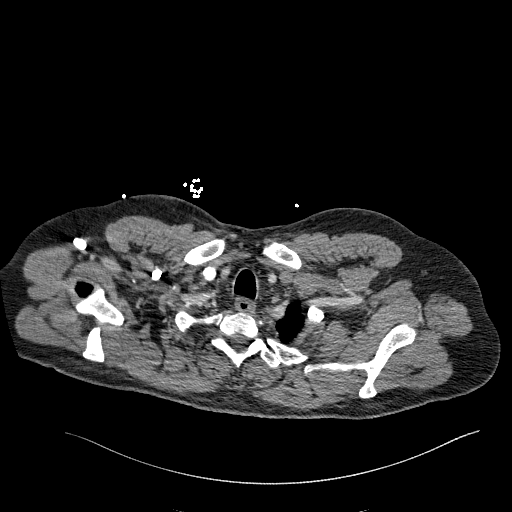

[Series 8: coronal mpr · coronal · 0.51mm/px · 1 of 150 slices shown]
[im 75/150  soft-tissue]
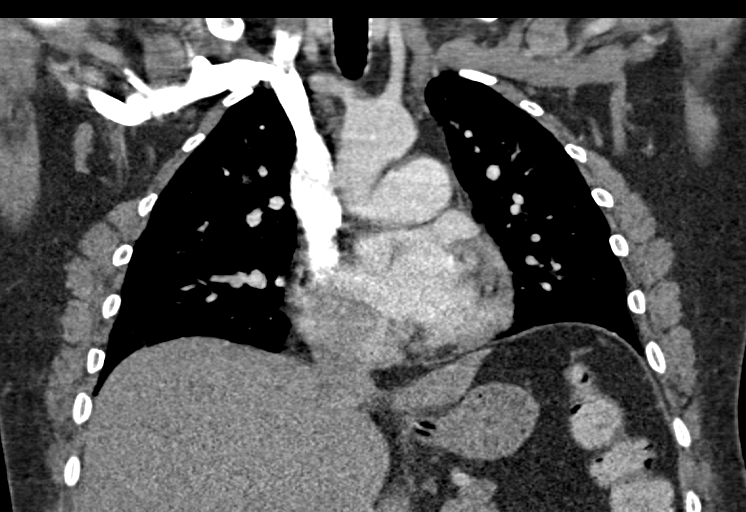

[Series 12: abd/ pelvis 5.0 i30f 2 · axial · 0.95mm/px · z∈[+865,+965]mm · 2 of 101 slices shown]
[im 21/101  lung]
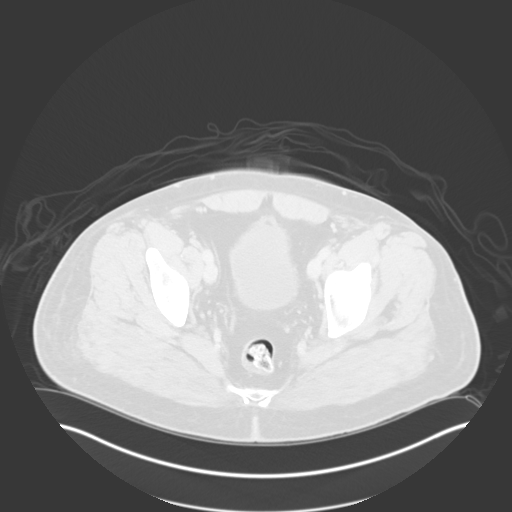
[im 41/101  lung]
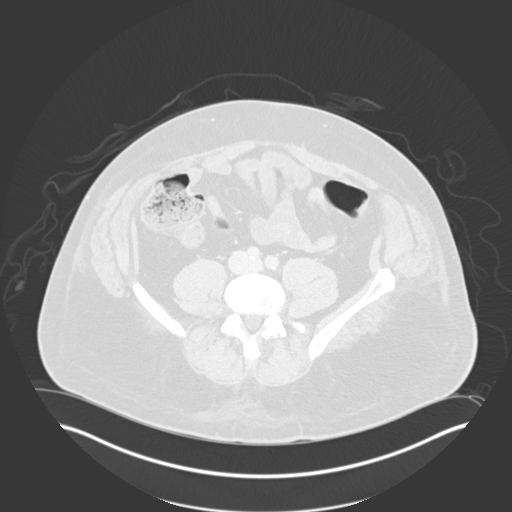

[17 of 46 positions shown; findings below may reference images not displayed]

FINDINGS: CTA CHEST FINDINGS

Cardiovascular: No filling defects within the pulmonary arterial
tree to suggest underlying pulmonary embolism. Heart size is normal.
There is no significant pericardial fluid, thickening or pericardial
calcification. No atherosclerotic calcifications in the thoracic
aorta or the coronary arteries.

Mediastinum/Nodes: No pathologically enlarged mediastinal or hilar
lymph nodes. Esophagus is unremarkable in appearance. No axillary
lymphadenopathy.

Lungs/Pleura: Patchy multifocal ill-defined areas of ground-glass
attenuation scattered throughout the lungs bilaterally, concerning
for atypical infection such as viral pneumonia. No pleural
effusions. No suspicious appearing pulmonary nodules or masses are
noted.

Musculoskeletal: There are no aggressive appearing lytic or blastic
lesions noted in the visualized portions of the skeleton.

Review of the MIP images confirms the above findings.

CT ABDOMEN and PELVIS FINDINGS

Hepatobiliary: No suspicious cystic or solid hepatic lesions. No
intra or extrahepatic biliary ductal dilatation. Status post
cholecystectomy.

Pancreas: No pancreatic mass. No pancreatic ductal dilatation. No
pancreatic or peripancreatic fluid or inflammatory changes.

Spleen: Unremarkable.

Adrenals/Urinary Tract: Right kidney and bilateral adrenal glands
are normal in appearance. 1.4 cm low-intermediate attenuation lesion
in the medial aspect of the interpolar region of the left kidney,
too small to definitively characterize, but favored to represent a
mildly proteinaceous cyst, similar to the prior examination. No
hydroureteronephrosis. Urinary bladder is normal in appearance.

Stomach/Bowel: Normal appearance of the stomach. No pathologic
dilatation of small bowel or colon. Status post appendectomy.

Vascular/Lymphatic: Aortic atherosclerosis, without evidence of
aneurysm or dissection in the abdominal or pelvic vasculature. No
lymphadenopathy noted in the abdomen or pelvis.

Reproductive: Prostate gland and seminal vesicles are unremarkable.

Other: No significant volume of ascites.  No pneumoperitoneum.

Musculoskeletal: There are no aggressive appearing lytic or blastic
lesions noted in the visualized portions of the skeleton.

Review of the MIP images confirms the above findings.
IMPRESSION: 1. No evidence of pulmonary embolism. However, there are a spectrum
of findings in the lungs which can be seen with acute atypical
infection (as well as other non-infectious etiologies). In
particular, viral pneumonia (including [id number]) should be
considered in the appropriate clinical setting.
2. No acute findings noted in the abdomen or pelvis to account for
the patient's symptoms.
3. Aortic atherosclerosis.
4. Additional incidental findings, as above.
